# Patient Record
Sex: Male | Born: 1995 | Race: Black or African American | Hispanic: No | Marital: Single
Health system: Midwestern US, Community
[De-identification: ages and names within clinical notes are randomized; demographics above are authoritative.]

## PROBLEM LIST (undated history)

## (undated) DIAGNOSIS — F319 Bipolar disorder, unspecified: Secondary | ICD-10-CM

## (undated) DIAGNOSIS — F209 Schizophrenia, unspecified: Secondary | ICD-10-CM

---

## 2019-07-11 ENCOUNTER — Ambulatory Visit: Payer: Self-pay | Admitting: Psychiatry

## 2020-12-29 ENCOUNTER — Other Ambulatory Visit: Payer: Self-pay

## 2020-12-29 ENCOUNTER — Ambulatory Visit (HOSPITAL_COMMUNITY)
Admission: EM | Admit: 2020-12-29 | Discharge: 2020-12-29 | Disposition: A | Discharge status: Home / Self Care | Payer: PRIVATE HEALTH INSURANCE | Attending: Psychiatry | Admitting: Psychiatry

## 2020-12-29 DIAGNOSIS — F411 Generalized anxiety disorder: Secondary | ICD-10-CM

## 2020-12-29 DIAGNOSIS — F209 Schizophrenia, unspecified: Secondary | ICD-10-CM | POA: Diagnosis not present

## 2020-12-29 LAB — COMPREHENSIVE METABOLIC PANEL
ALT: 24 U/L (ref 0–44)
AST: 32 U/L (ref 15–41)
Albumin: 4.2 g/dL (ref 3.5–5.0)
Alkaline Phosphatase: 61 U/L (ref 38–126)
Anion gap: 7 (ref 5–15)
BUN: <5 mg/dL — ABNORMAL LOW (ref 6–20)
CO2: 28 mmol/L (ref 22–32)
Calcium: 9.5 mg/dL (ref 8.9–10.3)
Chloride: 104 mmol/L (ref 98–111)
Creatinine, Ser: 0.88 mg/dL (ref 0.61–1.24)
GFR, Estimated: >60 mL/min (ref >60)
Glucose, Bld: 96 mg/dL (ref 70–99)
Potassium: 3.8 mmol/L (ref 3.5–5.1)
Sodium: 139 mmol/L (ref 135–145)
Total Bilirubin: 0.6 mg/dL (ref 0.3–1.2)
Total Protein: 6.9 g/dL (ref 6.5–8.1)

## 2020-12-29 LAB — CBC
HCT: 44 % (ref 39.0–52.0)
Hemoglobin: 14 g/dL (ref 13.0–17.0)
MCH: 30.2 pg (ref 26.0–34.0)
MCHC: 31.8 g/dL (ref 30.0–36.0)
MCV: 94.8 fL (ref 80.0–100.0)
Platelets: 177 10*3/uL (ref 150–400)
RBC: 4.64 MIL/uL (ref 4.22–5.81)
RDW: 12.9 % (ref 11.5–15.5)
WBC: 5.6 10*3/uL (ref 4.0–10.5)
nRBC: 0 % (ref 0.0–0.2)

## 2020-12-29 LAB — LIPID PANEL
Cholesterol: 138 mg/dL (ref 0–200)
HDL: 52 mg/dL (ref >40)
LDL Cholesterol: 77 mg/dL (ref 0–99)
Total CHOL/HDL Ratio: 2.7 RATIO
Triglycerides: 47 mg/dL (ref <150)
VLDL: 9 mg/dL (ref 0–40)

## 2020-12-29 LAB — TSH: TSH: 2.574 u[IU]/mL (ref 0.350–4.500)

## 2020-12-29 MED ORDER — ARIPIPRAZOLE 10 MG PO TABS
20.0000 mg | ORAL_TABLET | Freq: Once | ORAL | Status: AC
  Administered 2020-12-29: 20 mg via ORAL
  Filled 2020-12-29: qty 2
  Filled 2020-12-29: qty 14

## 2020-12-29 MED ORDER — ARIPIPRAZOLE 20 MG PO TABS: 20.0000 mg | ORAL_TABLET | Freq: Once | ORAL | 0 refills | Status: DC

## 2020-12-29 NOTE — Discharge Instructions (Signed)
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
## 2020-12-29 NOTE — ED Notes (Signed)
Patient alert and oriented x 4, denies SI, HI and AVH. Patient received after visit summary with follow up instructions to follow up outpatient tomorrow  at the [address] address, on the second floor. Patient discharged home.

## 2020-12-29 NOTE — Progress Notes (Signed)
Patient received Abilify 20 mg PO. Patient able to swallow medication without any problems.
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
## 2020-12-30 ENCOUNTER — Emergency Department (HOSPITAL_COMMUNITY)
Admission: EM | Admit: 2020-12-30 | Discharge: 2021-01-06 | Disposition: A | Discharge status: Home / Self Care | Payer: PRIVATE HEALTH INSURANCE | Attending: Emergency Medicine | Admitting: Emergency Medicine

## 2020-12-30 ENCOUNTER — Encounter (HOSPITAL_COMMUNITY): Payer: Self-pay | Admitting: Psychiatry

## 2020-12-30 ENCOUNTER — Telehealth (HOSPITAL_COMMUNITY): Payer: Self-pay | Admitting: Psychiatry

## 2020-12-30 ENCOUNTER — Other Ambulatory Visit: Payer: Self-pay

## 2020-12-30 ENCOUNTER — Encounter (HOSPITAL_COMMUNITY): Payer: Self-pay

## 2020-12-30 ENCOUNTER — Ambulatory Visit (INDEPENDENT_AMBULATORY_CARE_PROVIDER_SITE_OTHER): Payer: PRIVATE HEALTH INSURANCE | Admitting: Psychiatry

## 2020-12-30 DIAGNOSIS — Z046 Encounter for general psychiatric examination, requested by authority: Secondary | ICD-10-CM | POA: Diagnosis not present

## 2020-12-30 DIAGNOSIS — F1210 Cannabis abuse, uncomplicated: Secondary | ICD-10-CM | POA: Diagnosis not present

## 2020-12-30 DIAGNOSIS — F1994 Other psychoactive substance use, unspecified with psychoactive substance-induced mood disorder: Secondary | ICD-10-CM | POA: Diagnosis present

## 2020-12-30 DIAGNOSIS — F411 Generalized anxiety disorder: Secondary | ICD-10-CM | POA: Insufficient documentation

## 2020-12-30 DIAGNOSIS — Z008 Encounter for other general examination: Secondary | ICD-10-CM | POA: Insufficient documentation

## 2020-12-30 DIAGNOSIS — Z20822 Contact with and (suspected) exposure to covid-19: Secondary | ICD-10-CM | POA: Diagnosis not present

## 2020-12-30 DIAGNOSIS — R456 Violent behavior: Secondary | ICD-10-CM | POA: Diagnosis not present

## 2020-12-30 DIAGNOSIS — F319 Bipolar disorder, unspecified: Secondary | ICD-10-CM | POA: Diagnosis not present

## 2020-12-30 DIAGNOSIS — F3113 Bipolar disorder, current episode manic without psychotic features, severe: Secondary | ICD-10-CM | POA: Diagnosis not present

## 2020-12-30 DIAGNOSIS — F209 Schizophrenia, unspecified: Secondary | ICD-10-CM | POA: Diagnosis present

## 2020-12-30 DIAGNOSIS — F250 Schizoaffective disorder, bipolar type: Secondary | ICD-10-CM | POA: Diagnosis present

## 2020-12-30 HISTORY — DX: Bipolar disorder, unspecified: F31.9

## 2020-12-30 HISTORY — DX: Schizophrenia, unspecified: F20.9

## 2020-12-30 LAB — COMPREHENSIVE METABOLIC PANEL
ALT: 25 U/L (ref 0–44)
AST: 40 U/L (ref 15–41)
Albumin: 4.5 g/dL (ref 3.5–5.0)
Alkaline Phosphatase: 67 U/L (ref 38–126)
Anion gap: 11 (ref 5–15)
BUN: 9 mg/dL (ref 6–20)
CO2: 24 mmol/L (ref 22–32)
Calcium: 10.1 mg/dL (ref 8.9–10.3)
Chloride: 102 mmol/L (ref 98–111)
Creatinine, Ser: 0.9 mg/dL (ref 0.61–1.24)
GFR, Estimated: >60 mL/min (ref >60)
Glucose, Bld: 124 mg/dL — ABNORMAL HIGH (ref 70–99)
Potassium: 4.6 mmol/L (ref 3.5–5.1)
Sodium: 137 mmol/L (ref 135–145)
Total Bilirubin: 0.3 mg/dL (ref 0.3–1.2)
Total Protein: 7.9 g/dL (ref 6.5–8.1)

## 2020-12-30 LAB — HEMOGLOBIN A1C
Hgb A1c MFr Bld: 5.3 % (ref 4.8–5.6)
Mean Plasma Glucose: 105 mg/dL

## 2020-12-30 LAB — CBC
HCT: 43.8 % (ref 39.0–52.0)
Hemoglobin: 14.4 g/dL (ref 13.0–17.0)
MCH: 30.6 pg (ref 26.0–34.0)
MCHC: 32.9 g/dL (ref 30.0–36.0)
MCV: 93 fL (ref 80.0–100.0)
Platelets: 199 10*3/uL (ref 150–400)
RBC: 4.71 MIL/uL (ref 4.22–5.81)
RDW: 13.2 % (ref 11.5–15.5)
WBC: 10.5 10*3/uL (ref 4.0–10.5)
nRBC: 0 % (ref 0.0–0.2)

## 2020-12-30 LAB — RESP PANEL BY RT-PCR (FLU A&B, COVID) ARPGX2
Influenza A by PCR: NEGATIVE
Influenza B by PCR: NEGATIVE
SARS Coronavirus 2 by RT PCR: NEGATIVE

## 2020-12-30 LAB — ETHANOL: Alcohol, Ethyl (B): <10 mg/dL (ref <10)

## 2020-12-30 LAB — SALICYLATE LEVEL: Salicylate Lvl: <7 mg/dL — ABNORMAL LOW (ref 7.0–30.0)

## 2020-12-30 LAB — ACETAMINOPHEN LEVEL: Acetaminophen (Tylenol), Serum: <10 ug/mL — ABNORMAL LOW (ref 10–30)

## 2020-12-30 MED ORDER — ARIPIPRAZOLE ER 400 MG IM PRSY: 400.0000 mg | PREFILLED_SYRINGE | Freq: Once | INTRAMUSCULAR | Status: DC

## 2020-12-30 MED ORDER — HYDROXYZINE HCL 10 MG PO TABS: 10.0000 mg | ORAL_TABLET | Freq: Three times a day (TID) | ORAL | 2 refills | Status: DC | PRN

## 2020-12-30 MED ORDER — MIRTAZAPINE 15 MG PO TABS: 15.0000 mg | ORAL_TABLET | Freq: Every day | ORAL | 2 refills | Status: DC

## 2020-12-30 NOTE — ED Notes (Signed)
Pt unable to sign MSE. In police custody under emergency IVC
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
## 2020-12-30 NOTE — Telephone Encounter (Signed)
Pt called requesting send today's presctiptions to [facility] on [address]

## 2020-12-30 NOTE — ED Notes (Signed)
Pt changed into purple scrubs, wanded by security, belongings placed in labeled bag and removed from room. Belongings consisted of a shirt, shoes, and a pair of pants.
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
## 2020-12-30 NOTE — ED Provider Notes (Signed)
[facility]Y DEPT [facility] Emergency Department Provider Note MRN:  [id number]  Arrival date & time: 12/31/20     Chief Complaint   Psychiatric Evaluation (Emergency IVC)   History of Present Illness   Andres Mccall is a 25 y.o. year-old male with a history of bipolar disorder presenting to the ED with chief complaint of psychiatric evaluation.  Patient was reportedly trying to and to his girlfriend's house.  Was banging on the door, wielding a gun.  And seemed to be talking to himself or hallucinating or responding to external stimuli.  Here under IVC.  Denies hallucinations, denies SI or HI.  Was intermittently agitated with police.  Not taking his medications.  Review of Systems  A complete 10 system review of systems was obtained and all systems are negative except as noted in the HPI and PMH.   Patient's Health History    Past Medical History:  Diagnosis Date  . Bipolar affective (HCC)   . Schizophrenia (HCC)     No past surgical history on file.  No family history on file.  Social History   Socioeconomic History  . Marital status: Single    Spouse name: Not on file  . Number of children: Not on file  . Years of education: Not on file  . Highest education level: Not on file  Occupational History  . Not on file  Tobacco Use  . Smoking status: Not on file  . Smokeless tobacco: Not on file  Substance and Sexual Activity  . Alcohol use: Not on file  . Drug use: Not on file  . Sexual activity: Not on file  Other Topics Concern  . Not on file  Social History Narrative  . Not on file   Social Determinants of Health   Financial Resource Strain: Not on file  Food Insecurity: Not on file  Transportation Needs: Not on file  Physical Activity: Not on file  Stress: Not on file  Social Connections: Not on file  Intimate Partner Violence: Not on file     Physical Exam   Vitals:   12/30/20 2234 12/30/20 2340  BP: (!) 175/109 (!) 143/100  Pulse: (!) 133  (!) 110  Resp: 20 (!) 25  Temp: 100.1 F (37.8 C)   SpO2: 99% 97%    CONSTITUTIONAL: Well-appearing, NAD NEURO:  Alert and oriented x 3, no focal deficits EYES:  eyes equal and reactive ENT/NECK:  no LAD, no JVD CARDIO: Tachycardic rate, well-perfused, normal S1 and S2 PULM:  CTAB no wheezing or rhonchi GI/GU:  normal bowel sounds, non-distended, non-tender MSK/SPINE:  No gross deformities, no edema SKIN:  no rash, atraumatic PSYCH: Withdrawn speech and behavior  *Additional and/or pertinent findings included in MDM below  Diagnostic and Interventional Summary    EKG Interpretation  Date/Time:    Ventricular Rate:    PR Interval:    QRS Duration:   QT Interval:    QTC Calculation:   R Axis:     Text Interpretation:        Labs Reviewed  COMPREHENSIVE METABOLIC PANEL - Abnormal; Notable for the following components:      Result Value   Glucose, Bld 124 (*)    All other components within normal limits  SALICYLATE LEVEL - Abnormal; Notable for the following components:   Salicylate Lvl <7.0 (*)    All other components within normal limits  ACETAMINOPHEN LEVEL - Abnormal; Notable for the following components:   Acetaminophen (Tylenol), Serum <10 (*)  All other components within normal limits  RAPID URINE DRUG SCREEN, HOSP PERFORMED - Abnormal; Notable for the following components:   Tetrahydrocannabinol POSITIVE (*)    All other components within normal limits  RESP PANEL BY RT-PCR (FLU A&B, COVID) ARPGX2  ETHANOL  CBC    No orders to display    Medications - No data to display   Procedures  /  Critical Care Procedures  ED Course and Medical Decision Making  I have reviewed the triage vital signs, the nursing notes, and pertinent available records from the EMR.  Listed above are laboratory and imaging tests that I personally ordered, reviewed, and interpreted and then considered in my medical decision making (see below for details).  IVC, unstable  psychiatric condition, noncompliant with medications, currently is calm will initiate labs for psych clearance and will need TTS evaluation.     Medically cleared, awaiting TTS recommendations, signed out to default provider.  Elmer Sow. Pilar Plate, MD [facility] Emergency Medicine [facility] [email or web address]  Final Clinical Impressions(s) / ED Diagnoses     ICD-10-CM   1. Encounter for psychological evaluation  Z00.8   2. Violent behavior  R45.6     ED Discharge Orders    None       Discharge Instructions Discussed with and Provided to Patient:   Discharge Instructions   None       Sabas Sous, MD 12/31/20 [phone]

## 2020-12-31 LAB — RAPID URINE DRUG SCREEN, HOSP PERFORMED
Amphetamines: NOT DETECTED
Barbiturates: NOT DETECTED
Benzodiazepines: NOT DETECTED
Cocaine: NOT DETECTED
Opiates: NOT DETECTED
Tetrahydrocannabinol: POSITIVE — AB

## 2020-12-31 MED ORDER — ARIPIPRAZOLE 10 MG PO TABS
20.0000 mg | ORAL_TABLET | Freq: Every day | ORAL | Status: DC
  Administered 2020-12-31 – 2021-01-06 (×7): 20 mg via ORAL
  Filled 2020-12-31 (×7): qty 2

## 2020-12-31 MED ORDER — NICOTINE 21 MG/24HR TD PT24
21.0000 mg | MEDICATED_PATCH | Freq: Every day | TRANSDERMAL | Status: DC
  Administered 2020-12-31 – 2021-01-06 (×7): 21 mg via TRANSDERMAL
  Filled 2020-12-31 (×7): qty 1

## 2020-12-31 MED ORDER — MIRTAZAPINE 7.5 MG PO TABS
15.0000 mg | ORAL_TABLET | Freq: Every day | ORAL | Status: DC
  Administered 2020-12-31 – 2021-01-05 (×6): 15 mg via ORAL
  Filled 2020-12-31 (×6): qty 2

## 2020-12-31 MED ORDER — HYDROXYZINE HCL 25 MG PO TABS
25.0000 mg | ORAL_TABLET | Freq: Three times a day (TID) | ORAL | Status: DC | PRN
  Administered 2020-12-31 – 2021-01-06 (×13): 25 mg via ORAL
  Filled 2020-12-31 (×13): qty 1

## 2020-12-31 MED ORDER — ZIPRASIDONE MESYLATE 20 MG IM SOLR
20.0000 mg | Freq: Once | INTRAMUSCULAR | Status: AC | PRN
  Administered 2021-01-01: 20 mg via INTRAMUSCULAR
  Filled 2020-12-31: qty 20

## 2020-12-31 MED ORDER — OLANZAPINE 5 MG PO TBDP
2.5000 mg | ORAL_TABLET | Freq: Two times a day (BID) | ORAL | Status: DC
  Administered 2020-12-31 – 2021-01-05 (×11): 2.5 mg via ORAL
  Filled 2020-12-31 (×11): qty 1

## 2020-12-31 NOTE — ED Notes (Signed)
Spoke with pt's mother on the phone, she would like to be called prior to patient discharge because she is his mode of safe transportation. She would also like to be updated on plan of care.

## 2020-12-31 NOTE — ED Notes (Signed)
Patient told NT that he was going to "bust" out of here at 10 pm.

## 2020-12-31 NOTE — ED Notes (Signed)
Pt grandmother, Rose Phi, called. Transferred call to pt room.
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
## 2020-12-31 NOTE — ED Notes (Signed)
Pt has been calm and cooperative all day. Prior to 13:45, he reported anxiety and frequently paced around his room. After receiving medication, he has not reported anxiety, and has been watching TV or sleeping in his bed.

## 2020-12-31 NOTE — ED Notes (Signed)
Pt is calm and cooperative. Quietly watching TV in bed.

## 2020-12-31 NOTE — ED Notes (Signed)
Pt's grandmother called to speak with pt, but he was asleep. Requested he call back.

## 2020-12-31 NOTE — ED Notes (Signed)
Called pt mother and transferred the call to a phone I took to his room
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
## 2020-12-31 NOTE — BH Assessment (Addendum)
Per Liborio Nixon, NP, this involuntary pt requires psychiatric hospitalization at this time.  At the direction of Nelly Rout, MD, this writer has begun searching for placement outside of the [facility] system.  The following facilities have been contacted to seek placement for this pt, with results as noted:  Beds available, information sent, decision pending: [facility]  Unable to reach: Earlene Plater (left message at 13:09)  At capacity: [facility]  [place] [facility] [place], [place] [id number] Health Coordinator [phone]

## 2020-12-31 NOTE — ED Notes (Addendum)
Pt one belonging bag placed in locker #[id number]

## 2020-12-31 NOTE — ED Notes (Signed)
Pt refused vitals 

## 2020-12-31 NOTE — Consult Note (Signed)
Psych consult received for medication mange ment. Patient currently being treated for substance induced mood disorder, that now appears to be psychosis. Will resume home medications and initiate olanzapine in hopes of early stabilization of acute psychiatric symptoms. Patient remains under the care of TTS at this time.   -Will order EKG as patient is receiving 2 antipsychotics.  -Continue working closely with [facility] for inpatient referrals. If necessary refer out of system if no beds at [facility].  -Labs reviewed appear to be normal except UDS positive for THC.  -Will add olanzapine 2.5mg  po BID. Orders placed. Above information was communicated with Dr. Effie Shy.  -Psychiatry to sign off at this time.

## 2020-12-31 NOTE — ED Provider Notes (Signed)
Emergency Medicine Observation Re-evaluation Note  Ryzen Deady is a 25 y.o. male, seen on rounds today.  Pt initially presented to the ED for complaints of Psychiatric Evaluation (Emergency IVC) Currently, the patient is sitting on his bed and appears fairly calm.  Physical Exam  BP (!) 144/97 (BP Location: Right Arm)   Pulse 79   Temp 98.4 F (36.9 C) (Oral)   Resp 20   Ht 6\' 2"  (1.88 m)   Wt 70.3 kg   SpO2 97%   BMI 19.90 kg/m  Physical Exam General: Well developed. Cardiac: Normal heart rate Lungs: Normal respiratory Psych: Not currently responding to internal stimuli  ED Course / MDM  EKG:   I have reviewed the labs performed to date as well as medications administered while in observation.  Recent changes in the last 24 hours include he is repeatedly asking for medications for anxiety.  His usual medicines of Atarax and Abilify have not yet been started.  I will order these.  I will also have psychiatry for additional recommendations for medications.  Plan  Current plan is for psychiatric admission. Patient is under full IVC at this time.   , MD 12/31/20 [phone]

## 2020-12-31 NOTE — ED Notes (Addendum)
Andres Mccall, pt mother, can be reached at [phone] with update on pt's status with getting transferred or being discharged.

## 2021-01-01 DIAGNOSIS — F209 Schizophrenia, unspecified: Secondary | ICD-10-CM | POA: Diagnosis present

## 2021-01-01 DIAGNOSIS — F250 Schizoaffective disorder, bipolar type: Secondary | ICD-10-CM | POA: Diagnosis present

## 2021-01-01 DIAGNOSIS — F319 Bipolar disorder, unspecified: Secondary | ICD-10-CM | POA: Diagnosis present

## 2021-01-01 MED ORDER — ZIPRASIDONE MESYLATE 20 MG IM SOLR
20.0000 mg | INTRAMUSCULAR | Status: AC | PRN
  Administered 2021-01-01: 20 mg via INTRAMUSCULAR
  Filled 2021-01-01: qty 20

## 2021-01-01 MED ORDER — STERILE WATER FOR INJECTION IJ SOLN
INTRAMUSCULAR | Status: AC
  Administered 2021-01-01: 1.2 mL
  Filled 2021-01-01: qty 10

## 2021-01-01 MED ORDER — ACETAMINOPHEN 325 MG PO TABS
650.0000 mg | ORAL_TABLET | Freq: Once | ORAL | Status: AC
  Administered 2021-01-01: 650 mg via ORAL
  Filled 2021-01-01: qty 2

## 2021-01-01 MED ORDER — ACETAMINOPHEN 500 MG PO TABS
1000.0000 mg | ORAL_TABLET | Freq: Once | ORAL | Status: AC
  Administered 2021-01-01: 1000 mg via ORAL
  Filled 2021-01-01: qty 2

## 2021-01-01 MED ORDER — LORAZEPAM 1 MG PO TABS
1.0000 mg | ORAL_TABLET | ORAL | Status: AC | PRN
  Administered 2021-01-01: 1 mg via ORAL
  Filled 2021-01-01: qty 1

## 2021-01-01 MED ORDER — LIP MEDEX EX OINT
TOPICAL_OINTMENT | Freq: Once | CUTANEOUS | Status: AC
  Administered 2021-01-01: 1 via TOPICAL
  Filled 2021-01-01: qty 7

## 2021-01-01 MED ORDER — ZIPRASIDONE MESYLATE 20 MG IM SOLR
20.0000 mg | Freq: Once | INTRAMUSCULAR | Status: AC | PRN
  Administered 2021-01-01: 20 mg via INTRAMUSCULAR
  Filled 2021-01-01: qty 20

## 2021-01-01 MED ORDER — OLANZAPINE 5 MG PO TBDP
5.0000 mg | ORAL_TABLET | Freq: Three times a day (TID) | ORAL | Status: DC | PRN
  Administered 2021-01-02 (×2): 5 mg via ORAL
  Filled 2021-01-01 (×2): qty 1

## 2021-01-01 MED ORDER — STERILE WATER FOR INJECTION IJ SOLN
INTRAMUSCULAR | Status: AC
  Administered 2021-01-01: 10 mL
  Filled 2021-01-01: qty 10

## 2021-01-01 NOTE — ED Notes (Signed)
Pt eat a sandwich, some crackers and juice

## 2021-01-01 NOTE — ED Notes (Signed)
Patient is walking out of room, asking to see his admission papers. RN explained that patient was IVC ed and there were no papers to show him. Patient will be here until behavioral health inpatient bed is found for him. Patient is walking out of room to speak with [employer] who are sitting with another patient. Patient walking out of room, cursing and getting agitated.

## 2021-01-01 NOTE — ED Notes (Signed)
Patient lying on floor with pillow and blanket. Staff requested that he return to his bed
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
## 2021-01-01 NOTE — ED Notes (Signed)
Received call from patient's mother.  Informed her of patient's restraint episode.  Mother states understanding.

## 2021-01-01 NOTE — BH Assessment (Signed)
[facility] Assessment Progress Note  Per Maxie Barb, NP, this involuntary pt continues to require psychiatric hospitalization at this time.  The following facilities have been contacted to seek placement for this pt, with results as noted:  Beds available, information sent, decision pending: [facility]  Unable to reach: [facility] (left message at 15:41) [facility] (left message at 15:42)  At capacity: [facility]   Doylene Canning, MA Behavioral Health Coordinator [phone]

## 2021-01-02 MED ORDER — ACETAMINOPHEN 500 MG PO TABS
1000.0000 mg | ORAL_TABLET | Freq: Four times a day (QID) | ORAL | Status: DC | PRN
  Administered 2021-01-02 – 2021-01-06 (×7): 1000 mg via ORAL
  Filled 2021-01-02 (×9): qty 2

## 2021-01-02 MED ORDER — LORAZEPAM 1 MG PO TABS
1.0000 mg | ORAL_TABLET | ORAL | Status: AC | PRN
  Administered 2021-01-02: 1 mg via ORAL
  Filled 2021-01-02: qty 1

## 2021-01-02 MED ORDER — OLANZAPINE 5 MG PO TBDP
5.0000 mg | ORAL_TABLET | Freq: Three times a day (TID) | ORAL | Status: DC | PRN
  Administered 2021-01-03: 5 mg via ORAL
  Filled 2021-01-02: qty 1

## 2021-01-02 MED ORDER — STERILE WATER FOR INJECTION IJ SOLN
INTRAMUSCULAR | Status: AC
  Administered 2021-01-02: 1.2 mL
  Filled 2021-01-02: qty 10

## 2021-01-02 MED ORDER — ZIPRASIDONE MESYLATE 20 MG IM SOLR
20.0000 mg | INTRAMUSCULAR | Status: AC | PRN
  Administered 2021-01-02: 20 mg via INTRAMUSCULAR
  Filled 2021-01-02: qty 20

## 2021-01-02 MED ORDER — DIPHENHYDRAMINE HCL 50 MG/ML IJ SOLN
25.0000 mg | Freq: Once | INTRAMUSCULAR | Status: AC
  Administered 2021-01-04: 25 mg via INTRAMUSCULAR
  Filled 2021-01-02 (×2): qty 1

## 2021-01-02 MED ORDER — ACETAMINOPHEN 500 MG PO TABS
1000.0000 mg | ORAL_TABLET | Freq: Once | ORAL | Status: AC
  Administered 2021-01-02: 1000 mg via ORAL
  Filled 2021-01-02: qty 2

## 2021-01-02 NOTE — ED Provider Notes (Signed)
Emergency Medicine Observation Re-evaluation Note  Andres Mccall is a 25 y.o. male, seen on rounds today.  Pt initially presented to the ED for complaints of Psychiatric Evaluation (Emergency IVC) Currently, the patient is awaiting placement.  Physical Exam  BP (!) 119/92 (BP Location: Left Arm)   Pulse 94   Temp 98.3 F (36.8 C) (Oral)   Resp 16   Ht 6\' 2"  (1.88 m)   Wt 70.3 kg   SpO2 100%   BMI 19.90 kg/m  Physical Exam General: awake, alert, no distress Cardiac: normal HR Lungs: normal effort Psych: psychotic, though not agitated  ED Course / MDM  EKG:EKG Interpretation  Date/Time:  Thursday Dec 31 2020 14:19:56 EDT Ventricular Rate:  75 PR Interval:  118 QRS Duration: 84 QT Interval:  356 QTC Calculation: 397 R Axis:   95 Text Interpretation: Normal sinus rhythm Rightward axis Borderline ECG No old tracing to compare Confirmed by 01-31-1983 [phone]) on 12/31/2020 3:45:43 PM   I have reviewed the labs performed to date as well as medications administered while in observation.  Recent changes in the last 24 hours include prn medications added.  Plan  Current plan is for inpatient placement. Patient is under full IVC at this time.   01/02/2021, MD 01/02/21 [phone]

## 2021-01-02 NOTE — ED Notes (Signed)
Breakfast tray provided. 

## 2021-01-02 NOTE — ED Notes (Signed)
Provided pt with sandwich and soda 

## 2021-01-02 NOTE — BH Assessment (Signed)
This Probation officer met with patient this date to evaluate current mental health status. Patient denies any S/I, H/I or VH although reports ongoing VH but is vague in reference to content. Patient is observed to be somewhat distracted as this Probation officer interacts. Patient is circumstantial although can be redirected. Patient asks somewhat bizarre questions unrelated to assessment. Patient appears to be making progress although Leevy-Johnson continues to recommended a inpatient admission.

## 2021-01-02 NOTE — Progress Notes (Signed)
Per Brooke Leevy-Johnson,NP, patient meets criteria for inpatient treatment. There are no available or appropriate beds at [facility] today. CSW faxed referrals to the following facilities for review:  [facility]  TTS will continue to seek bed placement.  Cindel Daugherty, MSW, LCSW-A, LCAS-A Phone: [phone] Disposition/TOC  

## 2021-01-02 NOTE — ED Notes (Addendum)
[facility] in [place] provided pt last Abilify injection on 08/12/2020. Maurine Minister is listed as a contact for confirmation [phone] or [phone].

## 2021-01-02 NOTE — ED Notes (Signed)
Pt repeatedly approaching nurse station asking for food and drink. Provided one soda and declined other requests, including to use the phone. Pt requested something for pain, informed provider and received tylenol order. Pt refused benadryl shot stating "I've already had 3 shots since I've been here." Pt up walking around room but calm.

## 2021-01-03 MED ORDER — ZIPRASIDONE MESYLATE 20 MG IM SOLR
20.0000 mg | INTRAMUSCULAR | Status: AC | PRN
  Administered 2021-01-03: 20 mg via INTRAMUSCULAR
  Filled 2021-01-03: qty 20

## 2021-01-03 MED ORDER — LORAZEPAM 2 MG/ML IJ SOLN
2.0000 mg | Freq: Once | INTRAMUSCULAR | Status: AC
  Administered 2021-01-03: 2 mg via INTRAMUSCULAR
  Filled 2021-01-03: qty 1

## 2021-01-03 MED ORDER — OLANZAPINE 5 MG PO TBDP
5.0000 mg | ORAL_TABLET | Freq: Three times a day (TID) | ORAL | Status: DC | PRN
  Administered 2021-01-04 – 2021-01-05 (×2): 5 mg via ORAL
  Filled 2021-01-03 (×3): qty 1

## 2021-01-03 MED ORDER — LORAZEPAM 1 MG PO TABS
1.0000 mg | ORAL_TABLET | ORAL | Status: AC | PRN
  Administered 2021-01-05: 1 mg via ORAL
  Filled 2021-01-03 (×2): qty 1

## 2021-01-03 MED ORDER — STERILE WATER FOR INJECTION IJ SOLN
INTRAMUSCULAR | Status: AC
  Administered 2021-01-03: 1 mL
  Filled 2021-01-03: qty 10

## 2021-01-03 MED ORDER — POLYVINYL ALCOHOL 1.4 % OP SOLN
1.0000 [drp] | OPHTHALMIC | Status: DC | PRN
  Administered 2021-01-04: 1 [drp] via OPHTHALMIC
  Filled 2021-01-03: qty 15

## 2021-01-03 MED ORDER — MENTHOL 3 MG MT LOZG
1.0000 | LOZENGE | OROMUCOSAL | Status: DC | PRN
  Administered 2021-01-03: 3 mg via ORAL
  Filled 2021-01-03 (×2): qty 9

## 2021-01-03 NOTE — ED Notes (Signed)
Patient became very upset when told that he couldn't leave the hospital. He is pulling the code blue alarm and demanding that the doctor comes in and speaks to him. Will continue to monitor

## 2021-01-03 NOTE — ED Provider Notes (Addendum)
Andres Mccall has again continued to repetitively activate CODE BLUE alarm from his room.  He is requesting someone to speak with him about his medication.  I have requested another psychiatry consult for med management.   Koleen Distance, MD 01/03/21 1753   Patient already received IM Geodon at 5 PM, and he is currently still quite agitated.  I was asked for further medication, and he will receive benzodiazepine.  Med adjustment might be necessary per psychiatry as requested above.   Koleen Distance, MD 01/03/21 2101
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
## 2021-01-03 NOTE — ED Notes (Signed)
Throughout the night the patient has had several mood swings and became angry and started throwing stuff around at one point. Patient calmed himself down and asked for some medicine. People went to sleep and woke up demanding things. Had to be redirected several times. Patient is currently resting in his room.

## 2021-01-03 NOTE — ED Provider Notes (Signed)
Emergency Medicine Observation Re-evaluation Note  Andres Mccall is a 25 y.o. male, seen on rounds today.  Pt initially presented to the ED for complaints of Psychiatric Evaluation (Emergency IVC) .  Physical Exam  BP 120/81 (BP Location: Left Arm)   Pulse 65   Temp 97.9 F (36.6 C) (Oral)   Resp 19   Ht 6\' 2"  (1.88 m)   Wt 70.3 kg   SpO2 100%   BMI 19.90 kg/m  Physical Exam General: calm, cooperative Cardiac: normal rate Lungs: no increased WOB Psych: calm, cooperative  ED Course / MDM  EKG:EKG Interpretation  Date/Time:  Thursday Dec 31 2020 14:19:56 EDT Ventricular Rate:  75 PR Interval:  118 QRS Duration: 84 QT Interval:  356 QTC Calculation: 397 R Axis:   95 Text Interpretation: Normal sinus rhythm Rightward axis Borderline ECG No old tracing to compare Confirmed by 01-31-1983 [phone]) on 12/31/2020 3:45:43 PM   I have reviewed the labs performed to date as well as medications administered while in observation.  Recent changes in the last 24 hours include none.  Plan  Current plan is for awaiting placement to psych facility. Patient is under full IVC at this time.   01/02/2021, MD 01/03/21 [phone]

## 2021-01-04 MED ORDER — LORAZEPAM 2 MG/ML IJ SOLN
2.0000 mg | Freq: Once | INTRAMUSCULAR | Status: AC
  Administered 2021-01-04: 2 mg via INTRAMUSCULAR
  Filled 2021-01-04: qty 1

## 2021-01-04 MED ORDER — STERILE WATER FOR INJECTION IJ SOLN
INTRAMUSCULAR | Status: AC
  Administered 2021-01-04: 10 mL
  Filled 2021-01-04: qty 10

## 2021-01-04 MED ORDER — ACETAMINOPHEN 325 MG PO TABS: 650.0000 mg | ORAL_TABLET | Freq: Once | ORAL | Status: DC

## 2021-01-04 MED ORDER — LIP MEDEX EX OINT
TOPICAL_OINTMENT | Freq: Once | CUTANEOUS | Status: AC
  Filled 2021-01-04: qty 7

## 2021-01-04 MED ORDER — ZIPRASIDONE MESYLATE 20 MG IM SOLR
20.0000 mg | Freq: Once | INTRAMUSCULAR | Status: AC
  Administered 2021-01-04: 20 mg via INTRAMUSCULAR
  Filled 2021-01-04: qty 20

## 2021-01-04 MED ORDER — LORAZEPAM 1 MG PO TABS
1.0000 mg | ORAL_TABLET | Freq: Once | ORAL | Status: AC
  Administered 2021-01-04: 1 mg via ORAL
  Filled 2021-01-04: qty 1

## 2021-01-04 NOTE — ED Provider Notes (Signed)
Emergency Medicine Observation Re-evaluation Note  Andres Mccall is a 25 y.o. male, seen on rounds today.  Pt initially presented to the ED for complaints of Psychiatric Evaluation (Emergency IVC) Currently, the patient is more active and has required medications at time  Physical Exam  BP 132/84 (BP Location: Right Arm)   Pulse 100   Temp 98 F (36.7 C) (Oral)   Resp 18   Ht 1.88 m (6\' 2" )   Wt 70.3 kg   SpO2 100%   BMI 19.90 kg/m  Physical Exam  ED Course / MDM  EKG:EKG Interpretation  Date/Time:  Thursday Dec 31 2020 14:19:56 EDT Ventricular Rate:  75 PR Interval:  118 QRS Duration: 84 QT Interval:  356 QTC Calculation: 397 R Axis:   95 Text Interpretation: Normal sinus rhythm Rightward axis Borderline ECG No old tracing to compare Confirmed by [birth year]1982 [phone]) on 12/31/2020 3:45:43 PM   I have reviewed the labs performed to date as well as medications administered while in observation.  Recent changes in the last 24 hours include has required additional medication  Plan  Current plan is for placement. Patient is under full IVC at this time.   01/02/2021, MD 01/04/21 [phone]

## 2021-01-04 NOTE — ED Notes (Signed)
Pt initially requested eye drops and cough drop but refused once staff acquired and offered medication to him.

## 2021-01-04 NOTE — BH Assessment (Addendum)
Disposition:  Patient meets inpatient criteria per Doran Heater, NP . Per chart review, "Patient information has been sent to [facility] via secure chat to review for potential admission. Situation ongoing, CSW will continue to monitor progress".  Evening Disposition Counselor followed up with [facility] (Danika, RN) @[phone] , regarding patient's potential acceptance to [facility]. Per [facility], RN, no beds at [facility]. Patient re-faxed to the following facilities for consideration of bed placement.     [facility]  [facility]  [facility]  [facility]  [facility]  [facility] -@[phone]  [facility] called and stated that they are reviewing patient for admission. Requested UDS/BAL and those items were faxed at [phone]. [facility]  [facility]  [facility]  [facility]  [facility]  [facility]  [facility]  [facility]  [facility]  [facility]  [facility]  [facility]  [facility]  [facility]  [facility]  [facility]  [facility]  [facility]

## 2021-01-04 NOTE — ED Notes (Signed)
Called dietary and requested chicken fingers and fries which is appropriate for finger foods.

## 2021-01-04 NOTE — ED Notes (Signed)
Pt does not appear to be responding to internal stimuli. He has taken all medication offered and has even asked for some PRN this morning. Since taking medication he is resting more comfortably with fewer trips to the door.

## 2021-01-04 NOTE — ED Notes (Signed)
Pt's grandmother called and insisted that the NP call her because "He is not getting better."  This Clinical research associate explained that the NP is not available to call family at this time.  This Clinical research associate did not give out any HIPPA-related information about pt.  She wanted to know the administrators name.
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
## 2021-01-04 NOTE — ED Notes (Signed)
Patient has been an issue since he has been back here in TCU. His is getting ramped up again. Trying to walk out of room and cursing staff. Patient is threatening to leave however he is has IVC in place. All of security is back here trying to not to let him leave area.  Somehow patient has gotten a copy of his IVC and thinks he can leave.

## 2021-01-04 NOTE — ED Notes (Signed)
Patient was becoming agitated and pacing again. He walked out to nurses station with no shirt on. Patient is asking for his medications to be increased. Patient does not comprehend that he is IVC and can not leave. RN requested Dr.Curatola ED, to come back and talk to patient which he has done

## 2021-01-04 NOTE — Progress Notes (Signed)
Patient information has been sent to [facility] via secure chat to review for potential admission. Patient meets inpatient criteria per Maxie Barb, NP.   Situation ongoing, CSW will continue to monitor progress.    Signed:  Damita Dunnings, MSW, LCSW-A  01/04/2021 10:17 AM

## 2021-01-04 NOTE — ED Notes (Signed)
Finger foods ordered for pt with staff aware to not give any eating utensils r/t pt having a shank made out of a hospital toothbrush in his sock yesterday.  Pt is attention seeking, anxious and agitated, but not harsh with staff and is redirectable today. He was pleasant with this Clinical research associate and took offered medications appropriately.
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
## 2021-01-05 ENCOUNTER — Encounter (HOSPITAL_COMMUNITY): Payer: PRIVATE HEALTH INSURANCE | Admitting: Psychiatry

## 2021-01-05 ENCOUNTER — Encounter (HOSPITAL_COMMUNITY): Payer: Self-pay

## 2021-01-05 ENCOUNTER — Ambulatory Visit (HOSPITAL_COMMUNITY): Payer: PRIVATE HEALTH INSURANCE

## 2021-01-05 DIAGNOSIS — F3113 Bipolar disorder, current episode manic without psychotic features, severe: Secondary | ICD-10-CM | POA: Diagnosis not present

## 2021-01-05 MED ORDER — LORAZEPAM 1 MG PO TABS
1.0000 mg | ORAL_TABLET | Freq: Three times a day (TID) | ORAL | Status: AC | PRN
Start: 2021-01-05 — End: 2021-01-06
  Administered 2021-01-06: 1 mg via ORAL
  Filled 2021-01-05: qty 1

## 2021-01-05 MED ORDER — OLANZAPINE 5 MG PO TBDP
5.0000 mg | ORAL_TABLET | Freq: Three times a day (TID) | ORAL | Status: DC | PRN
  Administered 2021-01-05: 5 mg via ORAL
  Filled 2021-01-05: qty 1

## 2021-01-05 MED ORDER — ZIPRASIDONE MESYLATE 20 MG IM SOLR: 20.0000 mg | INTRAMUSCULAR | Status: DC | PRN

## 2021-01-05 MED ORDER — OLANZAPINE 10 MG PO TBDP
10.0000 mg | ORAL_TABLET | Freq: Two times a day (BID) | ORAL | Status: DC
  Administered 2021-01-05 – 2021-01-06 (×2): 10 mg via ORAL
  Filled 2021-01-05 (×2): qty 1

## 2021-01-05 MED ORDER — LIP MEDEX EX OINT
TOPICAL_OINTMENT | CUTANEOUS | Status: DC | PRN
  Filled 2021-01-05: qty 7

## 2021-01-05 MED ORDER — OLANZAPINE 5 MG PO TBDP
7.5000 mg | ORAL_TABLET | Freq: Once | ORAL | Status: AC
  Administered 2021-01-05: 7.5 mg via ORAL
  Filled 2021-01-05: qty 2

## 2021-01-05 NOTE — ED Notes (Addendum)
Patient given Olanzapine and Hydroxyzine per his request 30 minutes prior. Patient continues to exit room asking questions and requesting staff provide him with a toothbrush. Request denied due to report that patient used his toothbrush the day prior to construct a weapon. Additionally patient still requesting medication for his anxiety. Agitation continues.

## 2021-01-05 NOTE — ED Notes (Addendum)
Patient continuously exiting room, disturbing staff and other patients. Patient instructed to return to his room without success. Patient becoming increasingly agitated and requesting medication for agitation and anxiety. Informed by this nurse that medication will be provided shortly.

## 2021-01-05 NOTE — BH Assessment (Signed)
[facility] Assessment Progress Note  Per Maxie Barb, NP, this involuntary pt continues to require psychiatric hospitalization.  The following facilities have been contacted to seek placement for this pt, with results as noted:  Beds available, information sent, decision pending: [facility]  Unable to reach: [facility] (left message at 13:19)  At capacity: [facility]   Doylene Canning, [place] [facility] Coordinator [phone]

## 2021-01-05 NOTE — ED Provider Notes (Signed)
Emergency Medicine Observation Re-evaluation Note  Andres Mccall is a 25 y.o. male, seen on rounds today.  Pt initially presented to the ED for complaints of Psychiatric Evaluation (Emergency IVC) Currently, the patient is resting in bed  Physical Exam  BP 126/80 (BP Location: Right Arm)   Pulse (!) 103   Temp 98.1 F (36.7 C) (Oral)   Resp 20   Ht 1.88 m (6\' 2" )   Wt 70.3 kg   SpO2 99%   BMI 19.90 kg/m  Physical Exam General: Sleeping   ED Course / MDM  EKG:EKG Interpretation  Date/Time:  Thursday Dec 31 2020 14:19:56 EDT Ventricular Rate:  75 PR Interval:  118 QRS Duration: 84 QT Interval:  356 QTC Calculation: 397 R Axis:   95 Text Interpretation: Normal sinus rhythm Rightward axis Borderline ECG No old tracing to compare Confirmed by 01-31-1983 [phone]) on 12/31/2020 3:45:43 PM   I have reviewed the labs performed to date as well as medications administered while in observation.  Recent changes in the last 24 hours include patient has been more agitated overnight.  Has had increasing anxiety..  Plan  Current plan is for psychiatry to adjust patient's medications Patient is under full IVC at this time.   01/02/2021, MD 01/05/21 [phone]

## 2021-01-05 NOTE — Consult Note (Signed)
[facility] Face-to-Face Psychiatry Consult   Reason for Consult:  Psychiatric evaluation  Referring Physician:  Dr. Freida Busman  Patient Identification: Andres Mccall MRN:  [id number] Principal Diagnosis: <principal problem not specified> Diagnosis:  Active Problems:   Substance induced mood disorder (HCC)   Marijuana abuse, continuous   Schizophrenia (HCC)   Bipolar affective (HCC)   Total Time spent with patient: 20 minutes  Subjective:   Andres Mccall is a 25 y.o. male patient  with history of schizophrenia, bipolar affective disorder, substance induced mood, and marijuana abuse who presented to [facility] via IVC. Patient presented responding to external/internal stimuli and hallucinations with some agitation. Today he states he is in the hospital because, "My mom put me in here".  Andres Mccall, 1 y.o., male patient seen face to face  by this provider, consulted with Dr. Waldron Labs; and chart reviewed on 01/05/21.    HPI:   During evaluation Andres Mccall is laying in the bed in no acute distress.  He is sleepy. Had to arouse patient during assessment. He did not make any eye contact, he kept his eyes shut. Speech is clear and not pressured. He is alert, oriented x 4, calm and cooperative. Denies depression. States, "I just want to go home".  He is affect is flat. He is tangential and illogical. He does not appear to be responding to internal/external stimuli or delusional thoughts, but he was sleepy and short with most of the questions. It was hard to assess.  Patient denies suicidal/self-harm/homicidal ideation, psychosis, and paranoia.  Patient denies any auditory or visual hallucinations Patient began to get agitated when asking about going home. States he does not need to be in the hospital.  Contacted pharmacy per family request to inquire about Abilify injection.  Pharmacist states we do not have nor give the Abilify injection.  Patient will have to receive injection in outpatient setting.  RN states that  patient has been anxious, agitated at times. Reports he has been compliant with medications and is eating well. States patient did not sleep last night.   Past Psychiatric History: schizophrenia, bipolar affective, substance induced mood, marijuana use  Risk to Self:  pt denies Risk to Others:  pt denies Prior Inpatient Therapy:  yes Prior Outpatient Therapy:  yes  Past Medical History:  Past Medical History:  Diagnosis Date  . Bipolar affective (HCC)   . Schizophrenia (HCC)    No past surgical history on file. Family History: No family history on file. Family Psychiatric  History: unknown  Social History:  Social History   Substance and Sexual Activity  Alcohol Use Not on file     Social History   Substance and Sexual Activity  Drug Use Not on file    Social History   Socioeconomic History  . Marital status: Single    Spouse name: Not on file  . Number of children: Not on file  . Years of education: Not on file  . Highest education level: Not on file  Occupational History  . Not on file  Tobacco Use  . Smoking status: Not on file  . Smokeless tobacco: Not on file  Substance and Sexual Activity  . Alcohol use: Not on file  . Drug use: Not on file  . Sexual activity: Not on file  Other Topics Concern  . Not on file  Social History Narrative  . Not on file   Social Determinants of Health   Financial Resource Strain: Not on file  Food Insecurity: Not on  file  Transportation Needs: Not on file  Physical Activity: Not on file  Stress: Not on file  Social Connections: Not on file   Additional Social History:    Allergies:  No Known Allergies  Labs: No results found for this or any previous visit (from the past 48 hour(s)).  Current Facility-Administered Medications  Medication Dose Route Frequency Provider Last Rate Last Admin  . acetaminophen (TYLENOL) tablet 1,000 mg  1,000 mg Oral Q6H PRN Pricilla Loveless, MD   1,000 mg at 01/04/21 1443  .  acetaminophen (TYLENOL) tablet 650 mg  650 mg Oral Once Lorre Nick, MD      . ARIPiprazole (ABILIFY) tablet 20 mg  20 mg Oral Daily Mancel Bale, MD   20 mg at 01/05/21 0846  . ARIPiprazole ER (ABILIFY MAINTENA) 400 MG prefilled syringe 400 mg  400 mg Intramuscular Once Toy Cookey E, NP      . ARIPiprazole ER (ABILIFY MAINTENA) 400 MG prefilled syringe 400 mg  400 mg Intramuscular Once Toy Cookey E, NP      . hydrOXYzine (ATARAX/VISTARIL) tablet 25 mg  25 mg Oral TID PRN Mancel Bale, MD   25 mg at 01/05/21 1331  . lip balm (CARMEX) ointment   Topical PRN Lorre Nick, MD   Given at 01/05/21 1332  . menthol-cetylpyridinium (CEPACOL) lozenge 3 mg  1 lozenge Oral PRN Melene Plan, DO   3 mg at 01/03/21 0402  . mirtazapine (REMERON) tablet 15 mg  15 mg Oral QHS Mancel Bale, MD   15 mg at 01/04/21 2130  . nicotine (NICODERM CQ - dosed in mg/24 hours) patch 21 mg  21 mg Transdermal Daily Pollyann Savoy, MD   21 mg at 01/05/21 0845  . OLANZapine zydis (ZYPREXA) disintegrating tablet 10 mg  10 mg Oral BID Vernard Gambles H, NP      . OLANZapine zydis (ZYPREXA) disintegrating tablet 5 mg  5 mg Oral Q8H PRN Leevy-Johnson, Brooke A, NP   5 mg at 01/05/21 0320  . polyvinyl alcohol (LIQUIFILM TEARS) 1.4 % ophthalmic solution 1 drop  1 drop Both Eyes PRN Leevy-Johnson, Brooke A, NP   1 drop at 01/04/21 1028   Current Outpatient Medications  Medication Sig Dispense Refill  . Ascorbic Acid (VITAMIN C PO) Take 1 tablet by mouth daily.    . ASHWAGANDHA PO Take 1 tablet by mouth daily.    Marland Kitchen ELDERBERRY PO Take 1 capsule by mouth daily.    Marland Kitchen ibuprofen (ADVIL) 200 MG tablet Take 600-800 mg by mouth every 6 (six) hours as needed for fever, headache or mild pain.    . polyvinyl alcohol (LIQUIFILM TEARS) 1.4 % ophthalmic solution Place 1 drop into both eyes as needed for dry eyes.    . hydrOXYzine (ATARAX/VISTARIL) 10 MG tablet Take 1 tablet (10 mg total) by mouth 3 (three) times daily as  needed. 90 tablet 2  . mirtazapine (REMERON) 15 MG tablet Take 1 tablet (15 mg total) by mouth at bedtime. 30 tablet 2    Musculoskeletal: Strength & Muscle Tone: within normal limits Gait & Station: normal Patient leans: N/A   Psychiatric Specialty Exam:  Presentation  General Appearance: Disheveled  Eye Contact:Fleeting  Speech:Clear and Coherent; Normal Rate  Speech Volume:Normal  Handedness:Right   Mood and Affect  Mood:Depressed  Affect:Congruent   Thought Process  Thought Processes:Coherent  Descriptions of Associations:Tangential  Orientation:Full (Time, Place and Person)  Thought Content:Tangential; Illogical  History of Schizophrenia/Schizoaffective disorder:Yes  Duration of Psychotic Symptoms:No  data recorded Hallucinations:Hallucinations: None  Ideas of Reference:None  Suicidal Thoughts:Suicidal Thoughts: No  Homicidal Thoughts:Homicidal Thoughts: No   Sensorium  Memory:Immediate Fair; Recent Fair; Remote Fair  Judgment:Poor  Insight:Lacking   Executive Functions  Concentration:Poor  Attention Span:Poor  Recall:Poor  Fund of Knowledge:Fair  Language:Fair   Psychomotor Activity  Psychomotor Activity:Psychomotor Activity: Normal   Assets  Assets:Financial Resources/Insurance; Housing; Leisure Time; Physical Health; Resilience; Social Support   Sleep  Sleep:Sleep: Poor Number of Hours of Sleep: 0   Physical Exam: Physical Exam HENT:     Head: Normocephalic.     Right Ear: Ear canal normal.     Left Ear: Ear canal normal.  Eyes:     Conjunctiva/sclera: Conjunctivae normal.  Cardiovascular:     Rate and Rhythm: Normal rate.  Pulmonary:     Effort: No respiratory distress.  Musculoskeletal:        General: Normal range of motion.     Cervical back: Normal range of motion.  Skin:    General: Skin is warm.  Neurological:     Mental Status: He is alert and oriented to person, place, and time.  Psychiatric:         Attention and Perception: He is inattentive. He does not perceive auditory or visual hallucinations.        Mood and Affect: Mood is depressed.        Speech: Speech normal.        Behavior: Behavior is cooperative.        Thought Content: Thought content does not include homicidal or suicidal ideation.        Cognition and Memory: Cognition normal.        Judgment: Judgment is impulsive.    Review of Systems  Constitutional: Negative.   HENT: Negative.  Negative for hearing loss.   Respiratory: Negative.   Cardiovascular: Negative.  Negative for leg swelling.  Musculoskeletal: Negative.   Skin: Negative.   Neurological: Negative.   Psychiatric/Behavioral: Positive for depression.   Blood pressure 119/73, pulse 95, temperature 98.2 F (36.8 C), temperature source Oral, resp. rate 20, height 6\' 2"  (1.88 m), weight 70.3 kg, SpO2 99 %. Body mass index is 19.9 kg/m.   Treatment Plan Summary:  Patient continues to meet inpatient criteria.  Daily contact with patient to assess and evaluate symptoms and progress in treatment and Medication management  Case consulted with Dr. :  Zyprexa increased from 2.5 mg PO BID to Zyprexa 10 mg PO BID Agitation protocol added   Disposition:  Patient continues to meet inpatient psychiatric admission.   Bronwen Betters, NP 01/05/2021 3:43 PM

## 2021-01-06 MED ORDER — METHOCARBAMOL 500 MG PO TABS
500.0000 mg | ORAL_TABLET | Freq: Once | ORAL | Status: AC
  Administered 2021-01-06: 500 mg via ORAL
  Filled 2021-01-06: qty 1

## 2021-01-06 MED ORDER — LORAZEPAM 1 MG PO TABS
1.0000 mg | ORAL_TABLET | Freq: Once | ORAL | Status: AC
  Administered 2021-01-06: 1 mg via ORAL
  Filled 2021-01-06 (×2): qty 1

## 2021-01-06 MED ORDER — ARIPIPRAZOLE ER 400 MG IM SRER
400.0000 mg | Freq: Once | INTRAMUSCULAR | Status: AC
  Administered 2021-01-06: 400 mg via INTRAMUSCULAR

## 2021-01-06 MED ORDER — HYDROXYZINE HCL 25 MG PO TABS: 25.0000 mg | ORAL_TABLET | Freq: Three times a day (TID) | ORAL | 0 refills | Status: DC | PRN

## 2021-01-06 MED ORDER — OLANZAPINE 5 MG PO TBDP: 5.0000 mg | ORAL_TABLET | Freq: Two times a day (BID) | ORAL | 0 refills | Status: DC

## 2021-01-06 MED ORDER — ARIPIPRAZOLE 20 MG PO TABS: 20.0000 mg | ORAL_TABLET | Freq: Every day | ORAL | 0 refills | Status: DC

## 2021-01-06 NOTE — ED Provider Notes (Signed)
Emergency Medicine Observation Re-evaluation Note  Andres Mccall is a 25 y.o. male, seen on rounds today.  Pt initially presented to the ED for complaints of Psychiatric Evaluation (Emergency IVC) Currently, the patient is laying in bed.  Physical Exam  BP 127/84 (BP Location: Right Arm)   Pulse 76   Temp 97.6 F (36.4 C) (Oral)   Resp 18   Ht 6\' 2"  (1.88 m)   Wt 70.3 kg   SpO2 99%   BMI 19.90 kg/m  Physical Exam General: NAD Cardiac: normal rate Lungs: no increased WOB Psych: calm  ED Course / MDM  EKG:EKG Interpretation  Date/Time:  Thursday Dec 31 2020 14:19:56 EDT Ventricular Rate:  75 PR Interval:  118 QRS Duration: 84 QT Interval:  356 QTC Calculation: 397 R Axis:   95 Text Interpretation: Normal sinus rhythm Rightward axis Borderline ECG No old tracing to compare Confirmed by [birth year]31-1983 [phone]) on 12/31/2020 3:45:43 PM   I have reviewed the labs performed to date as well as medications administered while in observation.  Recent changes in the last 24 hours include none.  Plan  Current plan is for awaiting placement. Patient is under full IVC at this time.   01/02/2021, MD 01/06/21 [phone]

## 2021-01-06 NOTE — BH Assessment (Signed)
[facility] Assessment Progress Note  Per Vernard Gambles, NP, this pt does not require psychiatric hospitalization at this time.  Pt presents under IVC initiated by law enforcement on 5/25 and upheld by EDP Kennis Carina, MD, which expire tonight at 23:00.  Pt is psychiatrically cleared.  He has agreed to receive an Abilify injection before being discharge.  Discharge instructions include an appointment at [facility] on Wednesday, 01/27/2021 at 08:00.  EDP Mancel Bale, MD and pt's nurse, Addison Naegeli, have been notified.  Doylene Canning, MA Triage Specialist [phone]
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
## 2021-01-06 NOTE — Discharge Instructions (Signed)
For your behavioral health needs, you are advised to continue treatment at [facility].  Your next appointment is scheduled for Wednesday, January 27, 2021 at 8:00 am:       [facility]      [address]      [place], [place] [address]      [phone]

## 2021-01-06 NOTE — Progress Notes (Signed)
01/06/2021  1547  Patient states he does not want to wait one and a half hours for his mom to come. Pt states he has money to [place] home.

## 2021-01-27 ENCOUNTER — Encounter (HOSPITAL_COMMUNITY): Payer: PRIVATE HEALTH INSURANCE | Admitting: Psychiatry

## 2021-02-10 ENCOUNTER — Ambulatory Visit (HOSPITAL_COMMUNITY): Payer: No Payment, Other | Admitting: *Deleted

## 2021-02-10 ENCOUNTER — Telehealth (HOSPITAL_COMMUNITY): Payer: Self-pay | Admitting: *Deleted

## 2021-02-10 ENCOUNTER — Other Ambulatory Visit (HOSPITAL_COMMUNITY): Payer: Self-pay | Admitting: Psychiatry

## 2021-02-10 ENCOUNTER — Other Ambulatory Visit: Payer: Self-pay

## 2021-02-10 ENCOUNTER — Encounter (HOSPITAL_COMMUNITY): Payer: Self-pay

## 2021-02-10 ENCOUNTER — Ambulatory Visit (HOSPITAL_COMMUNITY)
Admission: EM | Admit: 2021-02-10 | Discharge: 2021-02-10 | Disposition: A | Discharge status: Home / Self Care | Payer: PRIVATE HEALTH INSURANCE

## 2021-02-10 ENCOUNTER — Inpatient Hospital Stay
Admit: 2021-02-10 | Discharge: 2021-02-10 | Disposition: A | Discharge status: Home / Self Care | Payer: PRIVATE HEALTH INSURANCE | Attending: Emergency Medicine

## 2021-02-10 VITALS — BP 109/72 | HR 83 | Ht 72.0 in | Wt 166.0 lb

## 2021-02-10 DIAGNOSIS — F1210 Cannabis abuse, uncomplicated: Secondary | ICD-10-CM | POA: Diagnosis not present

## 2021-02-10 DIAGNOSIS — F209 Schizophrenia, unspecified: Secondary | ICD-10-CM

## 2021-02-10 DIAGNOSIS — F1994 Other psychoactive substance use, unspecified with psychoactive substance-induced mood disorder: Secondary | ICD-10-CM

## 2021-02-10 MED ORDER — ARIPIPRAZOLE ER 400 MG IM PRSY
400.0000 mg | PREFILLED_SYRINGE | Freq: Once | INTRAMUSCULAR | Status: AC
  Administered 2021-02-10: 400 mg via INTRAMUSCULAR

## 2021-02-10 MED ORDER — HYDROXYZINE HCL 50 MG PO TABS: 25.0000 mg | ORAL_TABLET | Freq: Three times a day (TID) | ORAL | 2 refills | Status: DC | PRN

## 2021-02-10 MED ORDER — ARIPIPRAZOLE ER 400 MG IM PRSY: 400.0000 mg | PREFILLED_SYRINGE | Freq: Once | INTRAMUSCULAR | Status: DC

## 2021-02-10 MED ORDER — ARIPIPRAZOLE 5 MG TAB
5 mg | ORAL | Status: AC
Start: 2021-02-10 — End: 2021-02-10
  Administered 2021-02-10: 06:00:00 via ORAL

## 2021-02-10 MED ORDER — ARIPIPRAZOLE 20 MG TAB
20 mg | ORAL | Status: AC
Start: 2021-02-10 — End: 2021-02-10

## 2021-02-10 MED ORDER — ARIPIPRAZOLE 20 MG TAB
20 mg | ORAL_TABLET | Freq: Every day | ORAL | 0 refills | Status: AC
Start: 2021-02-10 — End: ?

## 2021-02-10 MED ORDER — IBUPROFEN 600 MG TAB
600 mg | ORAL | Status: AC
Start: 2021-02-10 — End: 2021-02-10
  Administered 2021-02-10: 07:00:00 via ORAL

## 2021-02-10 MED ORDER — IBUPROFEN 600 MG TAB
600 mg | ORAL | Status: DC
Start: 2021-02-10 — End: 2021-02-10
  Administered 2021-02-10: 07:00:00 via ORAL

## 2021-02-10 MED FILL — ARIPIPRAZOLE 5 MG TAB: 5 mg | ORAL | Qty: 4

## 2021-02-10 MED FILL — ARIPIPRAZOLE 20 MG TAB: 20 mg | ORAL | Qty: 1

## 2021-02-10 MED FILL — IBUPROFEN 600 MG TAB: 600 mg | ORAL | Qty: 1
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
## 2021-02-10 NOTE — Discharge Instructions (Addendum)

## 2021-02-10 NOTE — Telephone Encounter (Signed)
Provider spoke to patient today.  He notes that he has been more anxious lately.  He notes he has been taking more hydroxyzine as prescribed.  He asked provider for Ativan or Xanax.  Provider notes that those medications would not be given today.  He was agreeable to increasing hydroxyzine 25 mg 3 times a day to 50 mg 3 times a day as needed.  Patient also informed Clinical research associate that he has been taking Kanna herb to help manage his anxiety.  Provider informed patient that she had not heard of this her.  Provider also informed patient that she reviewed the FDA website and did not find that it was listed.  He informed Clinical research associate that it is somewhat effective and plans to continue it.  Provider encouraged patient to take his medications as prescribed and not take herbs that was not approved by the FDA.  He endorsed understanding however disagreed.  Patient notes that he has not have any side effects of Abilify and was agreeable to receiving his Abilify Maintena injection today.  Patient's mother notes that patient needs therapy.  Provider asked patient if he wants to follow-up with a therapist.  At this time he notes that he does not.  No other concerns noted at this time.

## 2021-02-10 NOTE — Telephone Encounter (Signed)
Mom and patient called this am, he is needing his oral and im meds. He was discharged from the hospital 3 weeks ago, and hasnt had a shot since may. He is hallucinating and getting agitated. He is to come in today for his shot but will have to return to see a dr [place] for his oral meds.

## 2021-02-10 NOTE — ED Notes (Signed)
PT decided that he would walk home upon d/c vs waiting for [employer] to pick him up and take him back home.
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
## 2021-02-10 NOTE — Telephone Encounter (Signed)
In for injection today, seen initially by provider to assess his psych need and provide an order for todays injection. He was in the hospital recently and did not follow up once he was discharged. His mom called this am to ask if he could get his shot today.

## 2021-02-10 NOTE — Progress Notes (Signed)
Patient ID: Andres Mccall, male   DOB: 1995/12/02, 25 y.o.   MRN: [id number] Mom of Andres Mccall had called this am to ask if she could bring him in as he is late for his shot and did not follow up after being discharged from the hospital. She states when she called he was hearing voices and acting up. Today he was pleasant, low energy, nice manners, and smells of marijuana. He is distracted and vague with answers but answers are appropriate. Toy Cookey DNP saw him first before his injection was given to provide the order for his shot and assess him. He got Abilify M 400 mg in his R DELTOID without difficulty. He is to return in two weeks to see Dr Doyne Keel and One month for his next shot. Completed Patient assistance paper work for his Abilify going forward.

## 2021-02-10 NOTE — ED Notes (Signed)
I have reviewed discharge instructions with the patient and parent.  The patient and parent verbalized understanding. Patient escorted to waiting room with mother, Gait steady

## 2021-02-10 NOTE — ED Provider Notes (Signed)
ED Provider Notes by Jarvis Newcomer, DO at 02/10/21 0206                Author: Jarvis Newcomer, DO  Service: --  Author Type: Physician       Filed: 02/10/21 0241  Date of Service: 02/10/21 0206  Status: Addendum          Editor: Jarvis Newcomer, DO (Physician)          Related Notes: Original Note by Jarvis Newcomer, DO (Physician) filed at 02/10/21 [phone]               EMERGENCY DEPARTMENT HISTORY AND PHYSICAL EXAM           Date: 02/10/2021   Patient Name: Tanner Ramos        History of Presenting Illness          Chief Complaint       Patient presents with        ?  Medication Refill           History Provided By: Patient      HPI: Tanner Ramos,  25 y.o. male with a significant past medical history of schizophrenia, bipolar disorder  presents to the ED with request for medication refill.  He is followed by psychiatry, typically gets Abilify 400 mg IM monthly, when unable he takes 20 mg of Abilify daily.  He ran out of medication 1 month ago, has an appointment on 7/25 with his psychiatrist.   He is here today with his mother and grandmother, they report that his behavior today has become more erratic, he is pacing, admits to hallucinations.  He reports no suicidal intent, no intent or plan to harm his family members.  He has taken a leftover  olanzapine to try to blunt his mood, has not been effective.      There are no other complaints, changes, or physical findings at this time.      PCP: Selena Batten, MD        No current facility-administered medications on file prior to encounter.          Current Outpatient Medications on File Prior to Encounter          Medication  Sig  Dispense  Refill           ?  ARIPiprazole (Abilify) 20 mg tablet  Take 20 mg by mouth daily.         ?  hydrOXYzine HCL (ATARAX) 25 mg tablet  Take  by mouth three (3) times daily as needed.         ?  OLANZapine (ZyPREXA) 5 mg tablet  Take  by mouth nightly as needed.               ?  [DISCONTINUED] ARIPiprazole  (ABILIFY MAINTENA) 400 mg injection  400 mg by IntraMUSCular route every twenty-eight (28) days.                 Past History        Past Medical History:     Past Medical History:        Diagnosis  Date         ?  Anxiety       ?  Bipolar 1 disorder (HCC)       ?  Psychiatric disorder           ?  Schizophrenia (HCC)  Past Surgical History:   History reviewed. No pertinent surgical history.      Family History:   History reviewed. No pertinent family history.      Social History:     Social History          Tobacco Use         ?  Smoking status:  Current Every Day Smoker              Packs/day:  0.50         ?  Smokeless tobacco:  Never Used       Substance Use Topics         ?  Alcohol use:  Yes     ?  Drug use:  Yes             Comment: Kanna           Allergies:     Allergies        Allergen  Reactions         ?  Gentamicin  Unknown (comments)             Review of Systems     Review of Systems    Constitutional: Negative for chills and fever.    HENT: Negative for ear pain, rhinorrhea, sneezing and sore throat.     Eyes: Negative for pain and discharge.    Respiratory: Negative for cough, shortness of breath and wheezing.     Cardiovascular: Negative for chest pain.    Gastrointestinal: Negative for abdominal pain, constipation, diarrhea, nausea and vomiting.    Endocrine: Negative for polydipsia and polyphagia.    Genitourinary: Negative for dysuria, flank pain, frequency, hematuria and urgency.    Musculoskeletal: Negative for arthralgias, back pain, joint swelling and neck pain.    Skin: Negative for rash.    Neurological: Negative for dizziness, weakness, light-headedness, numbness and headaches.    Hematological: Negative for adenopathy.    Psychiatric/Behavioral: Negative for agitation, behavioral problems and self-injury.    All other systems reviewed and are negative.           Physical Exam     Physical Exam   Vitals reviewed.    Constitutional:        General: He is not in acute distress.      Appearance: Normal appearance.   Cardiovascular:       Rate and Rhythm: Normal rate and regular rhythm.      Heart sounds: Normal heart sounds.    Pulmonary:       Effort: Pulmonary effort is normal.      Breath sounds: Normal breath sounds.   Abdominal :      General: Abdomen is flat.      Palpations: Abdomen is soft.     Musculoskeletal:      Cervical back: Normal range of motion.     Neurological:       Mental Status: He is alert.   Psychiatric :         Attention and Perception: Attention normal. He perceives auditory hallucinations.         Speech: Speech is  delayed.         Behavior: Behavior normal. Behavior is cooperative.      Comments: Patient is answering questions appropriately  in the emergency department.  A little delayed, soft speech.  No agitation or aggression is noted.  Lab and Diagnostic Study Results     Labs -    No results found for this or any previous visit (from the past 12 hour(s)).      Radiologic Studies -    @lastxrresult @     CT Results   (Last 48 hours)          None                 CXR Results   (Last 48 hours)          None                    Medical Decision Making and ED Course     - I am the first provider for this patient.  I reviewed the vital signs, available nursing notes, past medical history, past surgical history, family history and social history.      - Initial assessment performed. The patients presenting problems have been discussed, and they are in agreement with the care plan formulated and outlined with them.  I have encouraged them to ask questions as they arise throughout their visit.      Vital Signs-Reviewed the patient's vital signs.   Patient Vitals for the past 12 hrs:            Temp  Pulse  Resp  BP  SpO2            02/10/21 0208  98.4 ??F (36.9 ??C)  (!) 105  18  (!) 147/87  100 %           Differential Diagnosis & Medical Decision Making Provider Note:    Patient is seen and evaluated shortly after arrival, his mother and grandmother present  throughout interview and exam, they do assist with some history.  Patient is answering questions slowly but appropriately.   Orts having hallucinations today that are not harmful, he has been pacing, had increased agitation.  He reports no intent to harm himself or family members.   Differential diagnosis is reviewed and discussed, to include schizophrenic exacerbation, bipolar disorder, auditory/visual hallucinations, medication noncompliance, other psychiatric disorder.   Patient and family are requesting a dose of Abilify in the emergency department and prescription.  None of the family feel that he needs to be admitted to behavioral medicine, he denies intent to harm himself or others.  He is given Abilify 20 mg p.o.,  tolerated well and without difficulty, prescription is given, contact information is given for [facility].   MDM       ED Course:             Procedures     Performed by: Jarvis Newcomeravid H Smokey Melott, DO   Procedures          Disposition     Disposition: Condition stable   DC- Adult Discharges: All of the diagnostic tests were reviewed and questions answered. Diagnosis, care plan and treatment options were discussed.  The patient understands the instructions and will follow up as directed. The patients results have been  reviewed with them.  They have been counseled regarding their diagnosis.  The patient and parent verbally convey understanding and agreement of the signs, symptoms, diagnosis, treatment and prognosis and additionally agrees to follow up as recommended  with their PCP in 24 - 48 hours.  They also agree with the care-plan and convey that all of their questions have been answered.  I have also put together some discharge instructions  for them that include: 1) educational information regarding their diagnosis,  2) how to care for their diagnosis at home, as well a 3) list of reasons why they would want to return to the ED prior to their follow-up appointment, should their  condition change.      Schizophrenia   DISCHARGE PLAN:   1. There are no discharge medications for this patient.      2.      Follow-up Information               Follow up With  Specialties  Details  Why  Contact Info              [facility]    Go in 1 day    [address] [place] [address]   [phone]             3.  Return to ED if worse    4.      Current Discharge Medication List              START taking these medications          Details        !! ARIPiprazole (ABILIFY) 20 mg tablet  Take 1 Tablet by mouth daily.   Qty: 20 Tablet, Refills:  0   Start date: 02/10/2021               !! - Potential duplicate medications found. Please discuss with provider.              CONTINUE these medications which have NOT CHANGED          Details        !! ARIPiprazole (Abilify) 20 mg tablet  Take 20 mg by mouth daily.               !! - Potential duplicate medications found. Please discuss with provider.                       Diagnosis/Clinical Impression        Clinical Impression:       1.  Schizophrenia, unspecified type (HCC)         2.  Bipolar affective disorder, remission status unspecified (HCC)            Attestations: Jarvis Newcomer, DO      Please note that this dictation was completed with Dragon, the computer voice recognition software.  Quite often unanticipated grammatical, syntax, homophones, and other interpretive errors are inadvertently  transcribed by the computer software.  Please disregard these errors.  Please excuse any errors that have escaped final proofreading.  Thank you.

## 2021-02-10 NOTE — ED Notes (Signed)
Patient and mother reports patient is out of his monthly IM Abilify and his back up daily Abilify pills. Request medication refill.

## 2021-02-10 NOTE — ED Notes (Signed)
Patient reports headache. Dr. Amparo Bristol made aware and orders given for Ibuprofen.

## 2021-02-17 ENCOUNTER — Encounter (HOSPITAL_COMMUNITY): Payer: PRIVATE HEALTH INSURANCE | Admitting: Psychiatry

## 2021-02-20 ENCOUNTER — Emergency Department
Admission: EM | Admit: 2021-02-20 | Discharge: 2021-02-20 | Disposition: A | Discharge status: Home / Self Care | Payer: PRIVATE HEALTH INSURANCE | Attending: Emergency Medicine | Admitting: Emergency Medicine

## 2021-02-20 ENCOUNTER — Other Ambulatory Visit: Payer: Self-pay

## 2021-02-20 ENCOUNTER — Emergency Department: Payer: PRIVATE HEALTH INSURANCE

## 2021-02-20 DIAGNOSIS — M79674 Pain in right toe(s): Secondary | ICD-10-CM | POA: Insufficient documentation

## 2021-02-20 DIAGNOSIS — M545 Low back pain, unspecified: Secondary | ICD-10-CM | POA: Insufficient documentation

## 2021-02-20 DIAGNOSIS — Y92410 Unspecified street and highway as the place of occurrence of the external cause: Secondary | ICD-10-CM | POA: Insufficient documentation

## 2021-02-20 DIAGNOSIS — M542 Cervicalgia: Secondary | ICD-10-CM | POA: Insufficient documentation

## 2021-02-20 MED ORDER — ORPHENADRINE CITRATE ER 100 MG PO TB12: 100.0000 mg | ORAL_TABLET | Freq: Two times a day (BID) | ORAL | 0 refills | Status: DC

## 2021-02-20 MED ORDER — NAPROXEN 500 MG PO TABS
500.0000 mg | ORAL_TABLET | Freq: Once | ORAL | Status: AC
  Administered 2021-02-20: 500 mg via ORAL
  Filled 2021-02-20: qty 1

## 2021-02-20 MED ORDER — NAPROXEN 500 MG PO TABS: 500.0000 mg | ORAL_TABLET | Freq: Two times a day (BID) | ORAL | 0 refills | Status: DC

## 2021-02-20 MED ORDER — CYCLOBENZAPRINE HCL 10 MG PO TABS
10.0000 mg | ORAL_TABLET | Freq: Once | ORAL | Status: AC
  Administered 2021-02-20: 10 mg via ORAL
  Filled 2021-02-20: qty 1
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
## 2021-02-20 NOTE — Discharge Instructions (Addendum)
No acute findings on x-ray of the neck, back, and right toe.  Read and follow discharge care instructions.  Take medication as directed.
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
## 2021-02-21 ENCOUNTER — Emergency Department (HOSPITAL_COMMUNITY)
Admission: EM | Admit: 2021-02-21 | Discharge: 2021-03-01 | Disposition: A | Discharge status: Home / Self Care | Payer: PRIVATE HEALTH INSURANCE | Attending: Emergency Medicine | Admitting: Emergency Medicine

## 2021-02-21 ENCOUNTER — Encounter (HOSPITAL_COMMUNITY): Payer: Self-pay

## 2021-02-21 ENCOUNTER — Emergency Department (HOSPITAL_COMMUNITY): Payer: PRIVATE HEALTH INSURANCE

## 2021-02-21 DIAGNOSIS — F419 Anxiety disorder, unspecified: Secondary | ICD-10-CM | POA: Diagnosis not present

## 2021-02-21 DIAGNOSIS — F209 Schizophrenia, unspecified: Secondary | ICD-10-CM | POA: Diagnosis present

## 2021-02-21 DIAGNOSIS — M79641 Pain in right hand: Secondary | ICD-10-CM | POA: Diagnosis not present

## 2021-02-21 DIAGNOSIS — F250 Schizoaffective disorder, bipolar type: Secondary | ICD-10-CM | POA: Diagnosis present

## 2021-02-21 DIAGNOSIS — R456 Violent behavior: Secondary | ICD-10-CM | POA: Diagnosis not present

## 2021-02-21 DIAGNOSIS — F1999 Other psychoactive substance use, unspecified with unspecified psychoactive substance-induced disorder: Secondary | ICD-10-CM | POA: Diagnosis not present

## 2021-02-21 DIAGNOSIS — M549 Dorsalgia, unspecified: Secondary | ICD-10-CM | POA: Insufficient documentation

## 2021-02-21 DIAGNOSIS — F203 Undifferentiated schizophrenia: Secondary | ICD-10-CM | POA: Diagnosis not present

## 2021-02-21 DIAGNOSIS — R45850 Homicidal ideations: Secondary | ICD-10-CM

## 2021-02-21 DIAGNOSIS — Z046 Encounter for general psychiatric examination, requested by authority: Secondary | ICD-10-CM | POA: Diagnosis present

## 2021-02-21 DIAGNOSIS — F319 Bipolar disorder, unspecified: Secondary | ICD-10-CM | POA: Diagnosis not present

## 2021-02-21 DIAGNOSIS — Z20822 Contact with and (suspected) exposure to covid-19: Secondary | ICD-10-CM | POA: Insufficient documentation

## 2021-02-21 DIAGNOSIS — F1994 Other psychoactive substance use, unspecified with psychoactive substance-induced mood disorder: Secondary | ICD-10-CM | POA: Diagnosis present

## 2021-02-21 DIAGNOSIS — F1210 Cannabis abuse, uncomplicated: Secondary | ICD-10-CM | POA: Diagnosis present

## 2021-02-21 LAB — RAPID URINE DRUG SCREEN, HOSP PERFORMED
Amphetamines: NOT DETECTED
Barbiturates: NOT DETECTED
Benzodiazepines: NOT DETECTED
Cocaine: POSITIVE — AB
Opiates: NOT DETECTED
Tetrahydrocannabinol: POSITIVE — AB

## 2021-02-21 LAB — CBC
HCT: 47 % (ref 39.0–52.0)
Hemoglobin: 15.7 g/dL (ref 13.0–17.0)
MCH: 31.5 pg (ref 26.0–34.0)
MCHC: 33.4 g/dL (ref 30.0–36.0)
MCV: 94.2 fL (ref 80.0–100.0)
Platelets: 205 10*3/uL (ref 150–400)
RBC: 4.99 MIL/uL (ref 4.22–5.81)
RDW: 12.3 % (ref 11.5–15.5)
WBC: 6.5 10*3/uL (ref 4.0–10.5)
nRBC: 0 % (ref 0.0–0.2)

## 2021-02-21 LAB — COMPREHENSIVE METABOLIC PANEL
ALT: 12 U/L (ref 0–44)
AST: 27 U/L (ref 15–41)
Albumin: 4.7 g/dL (ref 3.5–5.0)
Alkaline Phosphatase: 66 U/L (ref 38–126)
Anion gap: 8 (ref 5–15)
BUN: 11 mg/dL (ref 6–20)
CO2: 25 mmol/L (ref 22–32)
Calcium: 9.5 mg/dL (ref 8.9–10.3)
Chloride: 105 mmol/L (ref 98–111)
Creatinine, Ser: 0.8 mg/dL (ref 0.61–1.24)
GFR, Estimated: >60 mL/min (ref >60)
Glucose, Bld: 77 mg/dL (ref 70–99)
Potassium: 4.1 mmol/L (ref 3.5–5.1)
Sodium: 138 mmol/L (ref 135–145)
Total Bilirubin: 0.5 mg/dL (ref 0.3–1.2)
Total Protein: 7.8 g/dL (ref 6.5–8.1)

## 2021-02-21 LAB — ETHANOL: Alcohol, Ethyl (B): <10 mg/dL (ref <10)

## 2021-02-21 LAB — SALICYLATE LEVEL: Salicylate Lvl: <7 mg/dL — ABNORMAL LOW (ref 7.0–30.0)

## 2021-02-21 LAB — ACETAMINOPHEN LEVEL: Acetaminophen (Tylenol), Serum: <10 ug/mL — ABNORMAL LOW (ref 10–30)

## 2021-02-21 MED ORDER — ZIPRASIDONE MESYLATE 20 MG IM SOLR
20.0000 mg | INTRAMUSCULAR | Status: AC | PRN
  Administered 2021-02-24: 20 mg via INTRAMUSCULAR
  Filled 2021-02-21: qty 20

## 2021-02-21 MED ORDER — OLANZAPINE 5 MG PO TBDP
5.0000 mg | ORAL_TABLET | Freq: Three times a day (TID) | ORAL | Status: DC | PRN
  Administered 2021-02-24 – 2021-02-25 (×2): 5 mg via ORAL
  Filled 2021-02-21 (×2): qty 1

## 2021-02-21 MED ORDER — NAPROXEN 500 MG PO TABS
500.0000 mg | ORAL_TABLET | Freq: Two times a day (BID) | ORAL | Status: DC
  Administered 2021-02-21 – 2021-03-01 (×15): 500 mg via ORAL
  Filled 2021-02-21 (×16): qty 1

## 2021-02-21 MED ORDER — LORAZEPAM 1 MG PO TABS
1.0000 mg | ORAL_TABLET | ORAL | Status: AC | PRN
  Administered 2021-02-22: 1 mg via ORAL
  Filled 2021-02-21: qty 1

## 2021-02-21 MED ORDER — OLANZAPINE 5 MG PO TBDP
5.0000 mg | ORAL_TABLET | Freq: Every day | ORAL | Status: DC
  Administered 2021-02-22 – 2021-02-26 (×5): 5 mg via ORAL
  Filled 2021-02-21 (×5): qty 1

## 2021-02-21 MED ORDER — NICOTINE 21 MG/24HR TD PT24
21.0000 mg | MEDICATED_PATCH | Freq: Every day | TRANSDERMAL | Status: DC
  Administered 2021-02-21 – 2021-02-28 (×8): 21 mg via TRANSDERMAL
  Filled 2021-02-21 (×8): qty 1
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
## 2021-02-21 NOTE — ED Notes (Signed)
Pt was wanded by security and cleared
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
## 2021-02-21 NOTE — ED Notes (Signed)
Pt has remained in room watching tv during my shift. No s/s of distress

## 2021-02-21 NOTE — ED Notes (Signed)
Pt belongings in locker #27  
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
## 2021-02-21 NOTE — ED Provider Notes (Signed)
I provided a substantive portion of the care of this patient.  I personally performed the entirety of the medical decision making for this encounter.      25 year old male presents to the IVC due to homicidal ideations.  He denies any SI.  UDS is positive for cocaine and THC.  Suspect drug-induced psychosis.  We will have psychiatry evaluate   Andres Nick, MD 02/21/21 1241

## 2021-02-21 NOTE — Progress Notes (Signed)
Per Carlis Abbott, patient meets criteria for inpatient treatment. There are no available or appropriate beds at [facility] today. CSW faxed referrals to the following facilities for review:  [facility] Orlie Pollen  TTS will continue to seek bed placement.  Crissie Reese, MSW, LCSW-A, LCAS-A Phone: [phone] Disposition/TOC

## 2021-02-21 NOTE — BH Assessment (Signed)
Clinician requested patient's nurse Suzette Battiest, RN) to place the TTS machine in patient's room for his initial assessment.Marland Kitchen

## 2021-02-21 NOTE — ED Notes (Signed)
Pt was making threatening statements to [place] officers en route, but has been cooperative w/ ED staff.

## 2021-02-21 NOTE — ED Notes (Addendum)
Pt moved to room 18 temporarily for TTS consult. Door shut for privacy but blinds left open. Pt calm and cooperative. [employer] still at bedside.

## 2021-02-22 MED ORDER — MENTHOL 3 MG MT LOZG
1.0000 | LOZENGE | OROMUCOSAL | Status: DC | PRN
  Filled 2021-02-22: qty 9

## 2021-02-22 NOTE — BH Assessment (Addendum)
[facility] Assessment Progress Note    Per Dorena Bodo, NP, this pt requires psychiatric hospitalization at this time.  Pt presents under IVC initiated by pt's mother and upheld by EDP Lorre Nick, MD.  [facility] and [facility] are both at capacity at this time.  At the direction of Nelly Rout, MD, this writer has sought placement for pt outside of the [facility] system.  The following facilities have been contacted to seek placement for this pt, with results as noted:  Beds available, information sent, decision pending: [facility]   At capacity: [facility]   Doylene Canning, [place] Behavioral Health Coordinator [phone]
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
## 2021-02-22 NOTE — ED Notes (Signed)
Pt given the phone to make 1/3 daily calls.
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
## 2021-02-22 NOTE — ED Notes (Signed)
Patient's mother Romie Levee) called to check on status of patient.

## 2021-02-22 NOTE — ED Provider Notes (Signed)
Emergency Medicine Observation Re-evaluation Note  Andres Mccall is a 25 y.o. male, seen on rounds today.  Pt initially presented to the ED for complaints of IVC Currently, the patient is sleeping.  Physical Exam  BP 119/83 (BP Location: Left Arm)   Pulse 72   Temp 97.7 F (36.5 C) (Oral)   Resp 16   SpO2 100%  Physical Exam General: NAD Cardiac: normal rate Lungs: no increased WOB Psych: calm, cooperative  ED Course / MDM  EKG:EKG Interpretation  Date/Time:  Sunday February 21 2021 14:42:19 EDT Ventricular Rate:  62 PR Interval:  140 QRS Duration: 94 QT Interval:  394 QTC Calculation: 399 R Axis:   88 Text Interpretation: Normal sinus rhythm Normal ECG When compared with ECG of 12/31/2020, No significant change was found Confirmed by Dione Booze ([id number]) on 02/22/2021 2:06:33 AM  I have reviewed the labs performed to date as well as medications administered while in observation.  Recent changes in the last 24 hours include none.  Plan  Current plan is for awaiting placement. Patient is under full IVC at this time.   Rolan Bucco, MD 02/22/21 [phone]

## 2021-02-23 LAB — RESP PANEL BY RT-PCR (FLU A&B, COVID) ARPGX2
Influenza A by PCR: NEGATIVE
Influenza B by PCR: NEGATIVE
SARS Coronavirus 2 by RT PCR: NEGATIVE

## 2021-02-23 MED ORDER — HYDROXYZINE HCL 25 MG PO TABS
25.0000 mg | ORAL_TABLET | Freq: Three times a day (TID) | ORAL | Status: DC | PRN
  Administered 2021-02-23 – 2021-02-26 (×6): 25 mg via ORAL
  Filled 2021-02-23 (×7): qty 1

## 2021-02-23 NOTE — Care Management (Signed)
  [facility] Medication Assistance Card Name: [name] ID (MRN): [id number] Bin: [id number] RX Group: [id number] Discharge Date: 02/23/2021 Expiration Date: 02/24/2021                                           (must be filled within 7 days of discharge)    Dear   : Andres Mccall  You have been approved to have the prescriptions written by your discharging physician filled through our [facility] (Medication Assistance Through [facility]) program. This program allows for a one-time (no refills) 34-day supply of selected medications for a low copay amount.  The copay is $3.00 per prescription. For instance, if you have one prescription, you will pay $3.00; for two prescriptions, you pay $6.00; for three prescriptions, you pay $9.00; and so on.  Only certain pharmacies are participating in this program with [facility]. You will need to select one of the pharmacies from the attached list and take your prescriptions, this letter, and your photo ID to one of the participating pharmacies.   We are excited that you are able to use the [facility] program to get your medications. These prescriptions must be filled within 7 days of hospital discharge or they will no longer be valid for the [facility] program. Should you have any problems with your prescriptions please contact your case management team member at [phone] for [place]/ [facility].  Thank you, [facility] Health Care Management

## 2021-02-23 NOTE — Care Management (Signed)
ED CM notified by [facility] Counselor concerning Medication Assistance. RNCM noted patient to be uninsured and would be eligible for [facility] Patient enrolled in program and letter placed in Epic  notes under [name] notes. Instructions given to [facility] ED RN to print letter and give to patient to redeem at [facility] with prescriptions. Also updated the daytime [facility] ED CM. No Further ED RN Case Management needs  identified

## 2021-02-23 NOTE — Consult Note (Signed)
Andres Mccall seen by this provider via tele psyche after reassessment by TTS counselor. Patient's mental status much clearer and it was decided that he could be psyche cleared. Girlfriend notified by TTS counselor and she notified patient's mother, who called this Clinical research associate with safety concerns for her grandchildren and people in the community. Mother is extremely upset with decision to discharge and cannot get the grandchildren to safety. Informed patient of the decision to keep him tonight until a better plan could be decided and for safety  planning to be in place.  He hung up the tele cart.   Per ED nurse, Beth there is a concern about IVC paperwork and first exam.

## 2021-02-23 NOTE — ED Notes (Signed)
Gave pt toothbrush for oral care this morning.Told pt he would have to return in once finished with care. He stated understanding. Toothbrush was retrieved and placed inside locker 27 with his other belongings.

## 2021-02-23 NOTE — BH Assessment (Addendum)
[facility] Assessment Progress Note     Per Dorena Bodo, NP, this involuntary pt continues to require psychiatric hospitalization at this time.  [facility] is at capacity at this time; status at [facility] is unknown.  At the direction of Nelly Rout, MD, this writer has sought placement for pt outside of the [facility] system.  The following facilities have been contacted to seek placement for this pt, with results as noted:   Beds available, information sent, decision pending: [facility] (re-referral; apparently lost) [facility]  Unable to reach: [employer] (left message at 15:57) Mannie Stabile (left message at 16:12) Earlene Plater (left message at 16:19)   At capacity: [facility]   Doylene Canning, [place] Health Coordinator [phone]

## 2021-02-23 NOTE — ED Provider Notes (Signed)
Emergency Medicine Observation Re-evaluation Note  Kaydon Husby is a 25 y.o. male, seen on rounds today.  Pt initially presented to the ED for complaints of schizophrenia like behavior Currently, the patient is awake, alert, ambulatory, in no distress.Marland Kitchen  Physical Exam  BP 125/76 (BP Location: Right Arm)   Pulse 75   Temp 98.2 F (36.8 C) (Oral)   Resp 17   SpO2 100%  Physical Exam General: No distress, ambulatory Cardiac: Regular rate and rhythm Lungs: No increased work of breathing Psych: Distracted  ED Course / MDM  EKG:EKG Interpretation  Date/Time:  Sunday February 21 2021 14:42:19 EDT Ventricular Rate:  62 PR Interval:  140 QRS Duration: 94 QT Interval:  394 QTC Calculation: 399 R Axis:   88 Text Interpretation: Normal sinus rhythm Normal ECG When compared with ECG of 12/31/2020, No significant change was found Confirmed by Dione Booze ([id number]) on 02/22/2021 2:06:33 AM  I have reviewed the labs performed to date as well as medications administered while in observation.  Recent changes in the last 24 hours include none.  Plan  Current plan is for behavioral health placement. Patient is under full IVC at this time.   Gerhard Munch, MD 02/23/21 [phone]
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
## 2021-02-24 MED ORDER — STERILE WATER FOR INJECTION IJ SOLN
INTRAMUSCULAR | Status: AC
  Administered 2021-02-24: 10 mL
  Filled 2021-02-24: qty 10

## 2021-02-24 MED ORDER — HYDROXYZINE HCL 25 MG PO TABS
25.0000 mg | ORAL_TABLET | Freq: Once | ORAL | Status: AC
  Administered 2021-02-24: 25 mg via ORAL
  Filled 2021-02-24: qty 1

## 2021-02-24 NOTE — ED Notes (Signed)
Patient standing at door and is becoming increasingly agitated and security stepped in. It appeared that security was agitating patient more. Patient stated if security walked into room he was going to knock him out. Additional security showed up and deescalated the situation. Patient was redirectable at that time. Snack given to patient.

## 2021-02-24 NOTE — ED Notes (Signed)
Pt resting with eyes closed and lights on inside exam room. Equal rise and fall of chest. Sitter outside of exam room. 1:1.

## 2021-02-24 NOTE — BH Assessment (Addendum)
Disposition:   Per [facility] provider Dorena Bodo, RN) on 02/13/2021, patient continues to meet inpatient psychiatric treatment. @2231 , [facility] , RN), no [facility] beds are available. Patient re-faxed to the following faclities for consideration of placement.   Destination  Service Provider Request Status Selected Services Address Phone Fax Patient Preferred  [facility]  Pending - Request Sent N/A [address] [place] [phone] --  [facility]  Pending - Request Sent N/A [address] [place] [phone] --  [facility]  Pending - Request Sent N/A [address] [place] [phone] --  [facility]  Pending - Request Sent N/A [address] [place] [address] [phone] --  [facility]  Pending - Request Sent N/A [address] [place], [place] [phone] --  [facility]  Pending - Request Sent N/A [address] [place] [phone] --  [facility]  Pending - Request Sent N/A [address], [address] [place] [phone] --  [facility]  Pending - Request Sent N/A [address] [facility] [place] [phone] --  [facility]  Pending - Request Sent N/A [address] [place] [phone] --  [facility]  Pending - Request Sent N/A [address] [place] [phone] --  [facility]  Pending - Request Sent N/A [address] [place] [phone] --  [facility]  Pending - Request Sent N/A [address] [place]  [phone] --  [facility]  Pending - Request Sent N/A [address] [place] [phone] --  [facility]  Pending - Request Sent N/A [address] [place] [address] [phone] --  [facility]  Pending - Request Sent N/A [address] [place] [phone] --  [facility]  Pending - Request Sent N/A [address] [place] [address] [place] [phone] --  [facility]  Pending - Request Sent N/A [address] [place] [phone] --  [facility]  Pending - Request Sent N/A [address] [place] [phone] --  [facility]  Pending - Request Sent N/A [phone] [address] [place] [phone] --  [facility]  Pending - Request Sent N/A [address] [place] [phone] --  [facility]  Pending - Request Sent N/A [address] [place] [address] [place] [address] [phone] --  [facility]  Pending - Request Sent N/A [address] [place] [phone] --  [facility]  Pending - Request Sent N/A [address] [place] [phone] --  [facility]  Pending - Request Sent N/A [address] [place] [phone]

## 2021-02-24 NOTE — ED Notes (Signed)
Report received from Gia B., RN.  

## 2021-02-24 NOTE — ED Notes (Signed)
Pt A&O x4. Cooperative and took morning medications without incident. Pt given orange juice as per requested.

## 2021-02-24 NOTE — BH Assessment (Signed)
Reassessment note- Pt presents sitting upright on end of his bed. Pt is alert and oriented. He denies SI, HI and AVH. Pt expressed frustration and then agitation that discharge is not being recommended. Pt requesting PRN med for agitation. NP and RN notified.

## 2021-02-25 DIAGNOSIS — F209 Schizophrenia, unspecified: Secondary | ICD-10-CM

## 2021-02-25 MED ORDER — ZIPRASIDONE MESYLATE 20 MG IM SOLR
20.0000 mg | Freq: Once | INTRAMUSCULAR | Status: AC
  Administered 2021-02-25: 20 mg via INTRAMUSCULAR
  Filled 2021-02-25: qty 20

## 2021-02-25 NOTE — BH Assessment (Addendum)
[facility] Assessment Progress Note   Per Dorena Bodo, NP, this involuntary pt continues to require psychiatric hospitalization at this time.  At the direction of Nelly Rout, MD, this writer has sought placement for pt at facilities outside of the [facility] system.  Pt has been referred to numerous facilities over the past few days but the following facilities have been contacted to seek placement today, with results as noted:  Beds available, information sent, decision pending: [facility]  Declined: [facility] (not in network)   At capacity: [facility] [place] [facility] [place], [place] [id number] Health Coordinator [phone]

## 2021-02-25 NOTE — ED Notes (Signed)
Pt has been redirectable, he states he just wants to leave

## 2021-02-25 NOTE — ED Notes (Signed)
Pt in bed resting. Respirations even and unlabored. °

## 2021-02-25 NOTE — ED Notes (Signed)
Pt is becoming agitated and states he is going to walk out. States he did not do anything to be here.

## 2021-02-25 NOTE — ED Notes (Signed)
Pt became agitated threatened to leave. Meds given per [facility]
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
## 2021-02-26 MED ORDER — NICOTINE POLACRILEX 2 MG MT GUM
2.0000 mg | CHEWING_GUM | OROMUCOSAL | Status: DC | PRN
  Administered 2021-02-26: 2 mg via ORAL
  Filled 2021-02-26: qty 1

## 2021-02-26 MED ORDER — OLANZAPINE 10 MG PO TBDP
10.0000 mg | ORAL_TABLET | Freq: Three times a day (TID) | ORAL | Status: DC | PRN
  Administered 2021-02-27: 10 mg via ORAL
  Filled 2021-02-26: qty 1

## 2021-02-26 NOTE — ED Notes (Signed)
Patient has been calm and cooperative this entire shift.  Mood is appropriate.  Affect is pleasant.  Interaction with staff has been appropriate demonstrated through patient laughing while engaging in stories about his children.  Patient reports cursing during day shift while speaking with RN but states, "I didn't curse at her though.  I was just cursing.  I just want to leave.  If I have to go to the other place that's fine.  I just want to get out of here."

## 2021-02-26 NOTE — BH Assessment (Signed)
[facility] Assessment Progress Note    Per Maxie Barb, NP, this involuntary pt continues to require psychiatric hospitalization at this time.  At the direction of Nelly Rout, MD, this writer has sought placement for pt at facilities outside of the [facility] system.  Pt has been referred to numerous facilities over the past few days but the following facilities have been contacted to seek placement today, with results as noted:   Beds available, information sent, decision pending: [facility]    At capacity: [facility]     Doylene Canning, [place] Behavioral Health Coordinator [phone]

## 2021-02-26 NOTE — ED Notes (Signed)
Pt alert this shift. Pt cooperative. Interactive with staff. No aggression or agitation. Denied SI/H and A/V hallucinations.
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
## 2021-02-26 NOTE — ED Notes (Signed)
Pt alert.  Cooperative.  Walking around room. Watching TV.  No s/s of distress. No agitation noted

## 2021-02-26 NOTE — ED Notes (Addendum)
Patient requesting something to help him sleep.  MD advised to give PRN Vistaril.

## 2021-02-27 DIAGNOSIS — F203 Undifferentiated schizophrenia: Secondary | ICD-10-CM

## 2021-02-27 MED ORDER — BISMUTH SUBSALICYLATE 262 MG/15ML PO SUSP
30.0000 mL | Freq: Three times a day (TID) | ORAL | Status: DC
  Administered 2021-02-27 – 2021-03-01 (×6): 30 mL via ORAL
  Filled 2021-02-27: qty 236

## 2021-02-27 MED ORDER — OLANZAPINE 10 MG PO TBDP
10.0000 mg | ORAL_TABLET | Freq: Every day | ORAL | Status: DC
  Administered 2021-02-27 – 2021-02-28 (×2): 10 mg via ORAL
  Filled 2021-02-27 (×2): qty 1

## 2021-02-27 MED ORDER — ONDANSETRON 4 MG PO TBDP
4.0000 mg | ORAL_TABLET | Freq: Three times a day (TID) | ORAL | Status: DC | PRN
  Administered 2021-02-27: 4 mg via ORAL
  Filled 2021-02-27: qty 1

## 2021-02-27 NOTE — ED Notes (Signed)
Patient complaining of abdominal pain.

## 2021-02-27 NOTE — Progress Notes (Signed)
Per Leroy Sea, patient meets criteria for inpatient treatment. There are no available or appropriate beds at [facility] today. CSW faxed referrals to the following facilities for review:  [facility] Donalda Ewings [facility] Desiree Hane  TTS will continue to seek bed placement.  Crissie Reese, MSW, LCSW-A, LCAS-A Phone: [phone] Disposition/TOC

## 2021-02-27 NOTE — ED Notes (Signed)
Pt alert this shift. Pt upset continues to be in ED. Pt denied SI/HI. Not A/V hallucinations noted. Anxious at times. Cooperative with staff.

## 2021-02-27 NOTE — ED Notes (Signed)
Patient calm and cooperative this shift.  Interaction with staff has been appropriate.  Patient denies SI/HI/AVH.  No distress noted.

## 2021-02-27 NOTE — ED Notes (Signed)
Patient currently standing in doorway interacting with staff appropriately.  Affect is bright demonstrated by patient smiling and laughing during conversation.  Mood is pleasant.

## 2021-02-27 NOTE — ED Provider Notes (Signed)
Emergency Medicine Observation Re-evaluation Note  Andres Mccall is a 25 y.o. male, seen on rounds today.  Pt initially presented to the ED for complaints of IVC Currently, the patient is resting comfortably.  Physical Exam  BP (!) 120/97 (BP Location: Right Arm)   Pulse 96   Temp (!) 97.4 F (36.3 C) (Oral)   Resp 16   SpO2 100%  Physical Exam General: resting, asleep   ED Course / MDM  EKG:EKG Interpretation  Date/Time:  Sunday February 21 2021 14:42:19 EDT Ventricular Rate:  62 PR Interval:  140 QRS Duration: 94 QT Interval:  394 QTC Calculation: 399 R Axis:   88 Text Interpretation: Normal sinus rhythm Normal ECG When compared with ECG of 12/31/2020, No significant change was found Confirmed by Dione Booze ([id number]) on 02/22/2021 2:06:33 AM  I have reviewed the labs performed to date as well as medications administered while in observation.  Recent changes in the last 24 hours include no acute events reported.  Plan  Current plan is for psychiatric placement. Andres Mccall is under involuntary commitment.      Wynetta Fines, MD 02/27/21 [phone]

## 2021-02-27 NOTE — Progress Notes (Signed)
[facility] advised that the patient is under review for placement.  Marguerette Sheller, MSW, LCSW-A, LCAS-A Phone: [phone] Disposition/TOC 

## 2021-02-27 NOTE — ED Notes (Signed)
Pt restless, pacing, elevated, upset about situation . Pt states he is hearing voices.

## 2021-02-28 NOTE — Progress Notes (Signed)
CSW contacted the [facility] in reference to initiate a care coordination referral. The referral was submitted.  Crissie Reese, MSW, LCSW-A, LCAS-A Phone: [phone] Disposition/TOC

## 2021-02-28 NOTE — ED Notes (Addendum)
Pt has been up wandering around the room. Pt has been stating NP Laurey Morale full name and wanting to know when she is coming to round on him. Pt also asked this RN her last name and RN explained to pt we are not allowed to give out that information. When NP Brook came in and pt saw her he asked for another provider although NP had not spoken to him.  Pt has been observed talking to himself and continues to ask when his IVC will be out and if they are going to renew it.

## 2021-02-28 NOTE — BH Assessment (Addendum)
Wrong chart

## 2021-02-28 NOTE — Progress Notes (Signed)
As of 02/27/21 patient is considered for inpatient psych. CSW sees no insurance listed under patient. Patient will need medicaid for ACT services. [facility] is currently not taking any new referrals. [facility] requires Medicaid.

## 2021-02-28 NOTE — Progress Notes (Signed)
Per Leroy Sea, patient meets criteria for inpatient treatment. There are no available or appropriate beds at [facility] today. CSW faxed referrals to the following facilities for review:   Service Provider Request Status Selected Services Address Phone Fax Patient Preferred  [facility]  Pending - Request Sent N/A [address] [place] [place] [address] [phone] --  [facility]  Pending - Request Sent N/A [address] [place] [address] [phone] --  [facility]  Pending - Request Sent N/A [address] [place] [address] [phone] --  [facility]  Pending - Request Sent N/A [address] [place] [address] [phone] --  [facility]  Pending - Request Sent N/A [address] [place], [place] [address] [phone] --  [facility]  Pending - Request Sent N/A [address] [place] [address] [phone] --  [facility]  Pending - Request Sent N/A [address], [place] [address] [phone] --  [facility]  Pending - Request Sent N/A [address] [place] [address] [phone] --  [facility]  Pending - Request Sent N/A [address] [place] [address] [phone] --  [facility]  Pending - Request Sent N/A [address] [place] [address] [phone] --  [facility]  Pending - Request Sent N/A [address] [place] [address] [phone] --  [facility]  Pending - Request Sent N/A [address] [place] [address] [phone] --  [facility]  Pending - Request Sent  N/A [address] [place] [address] [phone] --  [facility]  Pending - Request Sent N/A [address] [place] [address] [phone] --  [facility]  Pending - Request Sent N/A [address], [place] [address] [phone] --  [facility]  Pending - Request Sent N/A [address], [place] [address] [phone] --  [facility]  Pending - Request Sent N/A [address] [place] [address] [phone] --  [facility]  Pending - Request Sent N/A [address] [place] [address] [phone] --  [facility]  Pending - Request Sent N/A [phone] [place] [address] [phone] --  [facility]  Pending - Request Sent N/A [address] [place] [address] [phone] --  [facility]  Pending - Request Sent N/A [address] [place] [address] [phone] --  [facility]  Pending - Request Sent N/A [address] [place] [address] [phone] --  [facility]  Pending - Request Sent N/A [address] [place] [address] [phone] --  [facility]  Pending - Request Sent N/A [address] [place] [address] [phone] --  [facility]  Pending - Request Sent N/A [address] [place] [address] [phone] --  [facility]  Pending - Request Sent N/A [address], [place] [address] [phone] --  [facility]  Pending - Request Sent N/A [address] [place], [place] [address] [phone] --  [facility]  Pending -  Request Sent N/A  [address] [place] [address] [phone] --  [facility]  Pending - Request Sent N/A [address], [place] [address] [phone] --   TTS will continue to seek bed placement.  Crissie Reese, MSW, LCSW-A, LCAS-A Phone: [phone] Disposition/TOC

## 2021-02-28 NOTE — ED Provider Notes (Signed)
Emergency Medicine Observation Re-evaluation Note  Andres Mccall is a 25 y.o. male, seen on rounds today.  Pt initially presented to the ED for complaints of IVC Currently, the patient is resting/comfortable.  Physical Exam  BP 119/88 (BP Location: Right Arm)   Pulse 62   Temp (!) 97.4 F (36.3 C)   Resp 18   SpO2 99%  Physical Exam General: asleep/comfortable   ED Course / MDM  EKG:EKG Interpretation  Date/Time:  Sunday February 21 2021 14:42:19 EDT Ventricular Rate:  62 PR Interval:  140 QRS Duration: 94 QT Interval:  394 QTC Calculation: 399 R Axis:   88 Text Interpretation: Normal sinus rhythm Normal ECG When compared with ECG of 12/31/2020, No significant change was found Confirmed by Dione Booze ([id number]) on 02/22/2021 2:06:33 AM  I have reviewed the labs performed to date as well as medications administered while in observation.  Recent changes in the last 24 hours include no acute events reported by staff.  Plan  Current plan is for placement. Dionta Kissner is under involuntary commitment.      Wynetta Fines, MD 02/28/21 0730
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
## 2021-02-28 NOTE — ED Notes (Signed)
Pt has been attempting to pull hand rail off bathroom wall. Security aware.

## 2021-02-28 NOTE — ED Notes (Signed)
Patient restless throughout the night requesting pictures to color as he watched TV.  Interaction with staff has been appropriate.

## 2021-02-28 NOTE — BH Assessment (Addendum)
Patient was seen for re-asessment.  Patient is alert, oriented, cooperative, pleasant.  He denies SI/HI/Psychosis.  Patient states that he has been taking his medicine, he is seven days clean off drugs.  Patient states that he has a safety plan to stay with his mother in [place] when discharged.  Patient does request that he be discharged with a prescription for Zyprexa 10 mg.  TTS to have provider to see patient for final disposition.  TTS spoke to Maxie Barb, NP who states that patient's behavior has continued to be erratic and threatening while in the ED.  She states that she has spoken to patient's mother who is scared of him and is not willing to let him come to her home.  Therefore, continued inpatient is recommended.

## 2021-02-28 NOTE — Progress Notes (Signed)
[facility] advised that the patient is still under review for placement.   Crissie Reese, MSW, LCSW-A, LCAS-A Phone: [phone] Disposition/TOC

## 2021-03-01 ENCOUNTER — Encounter (HOSPITAL_COMMUNITY): Payer: No Payment, Other | Admitting: Psychiatry

## 2021-03-01 ENCOUNTER — Other Ambulatory Visit (HOSPITAL_COMMUNITY): Payer: Self-pay

## 2021-03-01 DIAGNOSIS — F203 Undifferentiated schizophrenia: Secondary | ICD-10-CM

## 2021-03-01 MED ORDER — ARIPIPRAZOLE ER 400 MG IM SRER
400.0000 mg | INTRAMUSCULAR | Status: DC
  Administered 2021-03-01: 400 mg via INTRAMUSCULAR
  Filled 2021-03-01: qty 2

## 2021-03-01 MED ORDER — TRAZODONE HCL 150 MG PO TABS
150.0000 mg | ORAL_TABLET | Freq: Every day | ORAL | 0 refills | Status: DC
  Filled 2021-03-01: qty 30, 30d supply, fill #0

## 2021-03-01 MED ORDER — OLANZAPINE 10 MG PO TBDP
10.0000 mg | ORAL_TABLET | Freq: Every day | ORAL | 0 refills | Status: DC
  Filled 2021-03-01 – 2021-03-10 (×2): qty 7, 7d supply, fill #0

## 2021-03-01 NOTE — ED Notes (Signed)
Pt DC d off unit to home per provider. Pt alert, calm, cooperative, no s/s of distress. DC information given to pt, pt acknowledged understanding. Belongings given to pt. Pt ambulatory off unit, escorted by RN. Pt using uber for transportation.

## 2021-03-01 NOTE — ED Notes (Signed)
Pt. Has been very calm and cooperative throughout the night. Pt hasn't shown any aggression or agitation towards staff. Been very clear in conversation with staff. Has been singing and rapping songs periodically. No observation of hallucinations tonight.
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
## 2021-03-02 ENCOUNTER — Other Ambulatory Visit (HOSPITAL_COMMUNITY): Payer: Self-pay

## 2021-03-10 ENCOUNTER — Other Ambulatory Visit (HOSPITAL_COMMUNITY): Payer: Self-pay

## 2021-03-11 ENCOUNTER — Ambulatory Visit (HOSPITAL_COMMUNITY): Payer: PRIVATE HEALTH INSURANCE

## 2021-04-01 ENCOUNTER — Encounter (HOSPITAL_COMMUNITY): Payer: PRIVATE HEALTH INSURANCE | Admitting: Psychiatry

## 2021-04-09 ENCOUNTER — Other Ambulatory Visit: Payer: Self-pay

## 2021-04-09 ENCOUNTER — Other Ambulatory Visit (HOSPITAL_COMMUNITY): Payer: Self-pay | Admitting: Physician Assistant

## 2021-04-09 ENCOUNTER — Ambulatory Visit (HOSPITAL_COMMUNITY): Payer: PRIVATE HEALTH INSURANCE | Admitting: *Deleted

## 2021-04-09 ENCOUNTER — Encounter (HOSPITAL_COMMUNITY): Payer: Self-pay

## 2021-04-09 ENCOUNTER — Ambulatory Visit (INDEPENDENT_AMBULATORY_CARE_PROVIDER_SITE_OTHER): Payer: No Payment, Other | Admitting: Clinical

## 2021-04-09 VITALS — BP 134/78 | HR 65 | Ht 72.0 in | Wt 170.0 lb

## 2021-04-09 DIAGNOSIS — F1994 Other psychoactive substance use, unspecified with psychoactive substance-induced mood disorder: Secondary | ICD-10-CM

## 2021-04-09 MED ORDER — ARIPIPRAZOLE ER 400 MG IM PRSY
400.0000 mg | PREFILLED_SYRINGE | Freq: Once | INTRAMUSCULAR | Status: AC
  Administered 2021-04-09: 400 mg via INTRAMUSCULAR

## 2021-04-09 NOTE — Progress Notes (Signed)
In as a walk in accompanied by his mom and is 26 yo son. He has not been seen or had his Abilify Injection since July. The last time I saw him he ad recently been discharged from the hospital and he was going to [facility] where his mom lives and hadnt anticipated him returning. He was last in the hospital in late July. He is currently homeless, he has broken up with his girlfriend who was living here and did in fact come from [place] today for this shot. He continues to demonstrate little insight. He is pleasant and joking and minimizes his illness and situation. He does endorse periods of visual hallucinations but denies having them now. He has a good personal appearance. He was seen first by [facility] as its been months since a provider here has seen him to assess him and the appropriateness of him getting his shot today. He got his Abilify M 400 mg injection in her R DELTOID without difficulty. Discussed with him no more walk in appts, he will have to keep a Tues or Thurs appt going forward. He verbalized his understanding.
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
## 2021-05-06 ENCOUNTER — Other Ambulatory Visit: Payer: Self-pay

## 2021-05-06 ENCOUNTER — Ambulatory Visit (INDEPENDENT_AMBULATORY_CARE_PROVIDER_SITE_OTHER): Payer: No Payment, Other | Admitting: Registered Nurse

## 2021-05-06 ENCOUNTER — Encounter (HOSPITAL_COMMUNITY): Payer: Self-pay | Admitting: Registered Nurse

## 2021-05-06 ENCOUNTER — Encounter (HOSPITAL_COMMUNITY): Payer: Self-pay

## 2021-05-06 ENCOUNTER — Ambulatory Visit (INDEPENDENT_AMBULATORY_CARE_PROVIDER_SITE_OTHER): Payer: No Payment, Other | Admitting: *Deleted

## 2021-05-06 VITALS — BP 133/77 | HR 88 | Ht 72.0 in | Wt 173.0 lb

## 2021-05-06 DIAGNOSIS — F209 Schizophrenia, unspecified: Secondary | ICD-10-CM

## 2021-05-06 DIAGNOSIS — F1994 Other psychoactive substance use, unspecified with psychoactive substance-induced mood disorder: Secondary | ICD-10-CM

## 2021-05-06 DIAGNOSIS — F1210 Cannabis abuse, uncomplicated: Secondary | ICD-10-CM

## 2021-05-06 DIAGNOSIS — F411 Generalized anxiety disorder: Secondary | ICD-10-CM | POA: Diagnosis not present

## 2021-05-06 MED ORDER — ARIPIPRAZOLE ER 400 MG IM PRSY
400.0000 mg | PREFILLED_SYRINGE | INTRAMUSCULAR | Status: AC
  Administered 2021-05-06 – 2022-02-09 (×4): 400 mg via INTRAMUSCULAR

## 2021-05-06 NOTE — Progress Notes (Signed)
APtient arrived ARIPiprazole ER (ABILIFY MAINTENA) 400 MG  Given today in Left-Arm. Tolerated Well. Patient pleasant as always . Noted still on the job search & his son will start daycare soon.

## 2021-05-06 NOTE — Progress Notes (Signed)
BH MD/PA/NP OP Progress Note  05/06/2021 3:15 PM Andres Mccall  MRN:  [id number]  Chief Complaint:  Chief Complaint   Abilify Maintena Injection    HPI: Andres Mccall, 25 y.o., male seen today for follow up and administration of long acting injectable (Abilify Maintena).  Patient has a psychiatric history of Schizophrenia, anxiety, depression, and marijuana use.  He is currently being managed on Abilify Maintena 400 mg monthly.  Patient reporting he has tolerated the medication well without any adverse reaction.    Today Andres Mccall is dressed appropriated for weather and well groomed.  He reports there has been minimal anxiety and when he feels stressed he goes for a walk to calm down.  He states there has been no depression and the Abilify Maintena continues to help with mood.  He denies any auditory/visual hallucinations, delusional thinking, and paranoia.  He reports that he feels that his mental health is stable.  He states he has been sleeping and eating without difficulty.  States he hasn't had to use the Vistaril or the Trazodone for anxiety and depression.  He denies suicidal/self-harm/homicidal ideation, psychosis, and paranoia.  GAD-7, PHQ-9, CSRRS, AIMS conducted by provider see scores below.   Andres Mccall states he is currently unemployed but looking for work.  States he is suppose to get a call from the [employer] next week related to application he filled out. States right now he spends most of his time with his children 5 yr old daughter and 1 yr old son.    Andres Mccall reports he continues to smoke marijuana on a daily basis.  States he has had some episodes of paranoia but no auditory or visual hallucination. Patient encouraged to avoid marijuana use and adverse reactions can mimic schizophrenia or exacerbate schizophrenia.  Understanding voiced.     Visit Diagnosis:    ICD-10-CM   1. Schizophrenia, unspecified type (HCC)  F20.9     2. Generalized anxiety disorder  F41.1     3.  Marijuana abuse, continuous  F12.10       Past Psychiatric History: General anxiety, Substance induced mood disorder, Marijuana abuse continuous, and Schizophrenia  Past Medical History:  Past Medical History:  Diagnosis Date   Bipolar affective (HCC)    Schizophrenia (HCC)    History reviewed. No pertinent surgical history.  Family Psychiatric History: Denies family psychiatric history  Family History: History reviewed. No pertinent family history.  Social History:  Social History   Socioeconomic History   Marital status: Single    Spouse name: Not on file   Number of children: Not on file   Years of education: Not on file   Highest education level: Not on file  Occupational History   Not on file  Tobacco Use   Smoking status: Unknown   Smokeless tobacco: Not on file  Substance and Sexual Activity   Alcohol use: Yes   Drug use: Yes    Types: Marijuana    Comment: Delta 8   Sexual activity: Not on file  Other Topics Concern   Not on file  Social History Narrative   Not on file   Social Determinants of Health   Financial Resource Strain: Not on file  Food Insecurity: Not on file  Transportation Needs: Not on file  Physical Activity: Not on file  Stress: Not on file  Social Connections: Not on file    Allergies: No Known Allergies  Metabolic Disorder Labs: Lab Results  Component Value Date   HGBA1C  5.3 12/29/2020   MPG 105 12/29/2020   No results found for: PROLACTIN Lab Results  Component Value Date   CHOL 138 12/29/2020   TRIG 47 12/29/2020   HDL 52 12/29/2020   CHOLHDL 2.7 12/29/2020   VLDL 9 12/29/2020   LDLCALC 77 12/29/2020   Lab Results  Component Value Date   TSH 2.574 12/29/2020    Therapeutic Level Labs: No results found for: LITHIUM No results found for: VALPROATE No components found for:  CBMZ  Current Medications: Current Outpatient Medications  Medication Sig Dispense Refill   hydrOXYzine (ATARAX/VISTARIL) 50 MG tablet  Take 0.5 tablets (25 mg total) by mouth 3 (three) times daily as needed for anxiety. 90 tablet 0   traZODone (DESYREL) 150 MG tablet Take 1 tablet (150 mg total) by mouth at bedtime. 30 tablet 0   Current Facility-Administered Medications  Medication Dose Route Frequency Provider Last Rate Last Admin   ARIPiprazole ER (ABILIFY MAINTENA) 400 MG prefilled syringe 400 mg  400 mg Intramuscular Q28 days Amazing Cowman B, NP   400 mg at 05/06/21 1330     Musculoskeletal: Strength & Muscle Tone: within normal limits Gait & Station: normal Patient leans: N/A  Psychiatric Specialty Exam: Review of Systems  Constitutional: Negative.   HENT: Negative.    Eyes: Negative.   Respiratory: Negative.    Cardiovascular: Negative.   Gastrointestinal: Negative.   Genitourinary: Negative.   Musculoskeletal: Negative.   Skin: Negative.   Neurological: Negative.   Hematological: Negative.   Psychiatric/Behavioral:  Negative for agitation (Denies), behavioral problems, decreased concentration, hallucinations (Denies), self-injury, sleep disturbance (Denies) and suicidal ideas (Denies). Dysphoric mood: Stable.Nervous/anxious: Stable.    There were no vitals taken for this visit.There is no height or weight on file to calculate BMI.  General Appearance: Casual and Well Groomed  Eye Contact:  Good  Speech:  Clear and Coherent and Normal Rate  Volume:  Normal  Mood:  Euthymic  Affect:  Appropriate and Congruent  Thought Process:  Coherent and Goal Directed  Orientation:  Full (Time, Place, and Person)  Thought Content: WDL and Logical   Suicidal Thoughts:  No  Homicidal Thoughts:  No  Memory:  Immediate;   Good Recent;   Good Remote;   Good  Judgement:  Intact  Insight:  Good and Present  Psychomotor Activity:  Normal  Concentration:  Concentration: Good and Attention Span: Good  Recall:  Good  Fund of Knowledge: Good  Language: Good  Akathisia:  No  Handed:  Right  AIMS (if indicated): done   Assets:  Communication Skills Desire for Improvement Financial Resources/Insurance Housing Leisure Time Physical Health Social Support  ADL's:  Intact  Cognition: WNL  Sleep:  Good   Screenings: AIMS    Flowsheet Row Office Visit from 05/06/2021 in [facility]  AIMS Total Score 0      GAD-7    Flowsheet Row Office Visit from 05/06/2021 in [facility] Clinical Support from 12/30/2020 in [facility]  Total GAD-7 Score 0 18      PHQ2-9    Flowsheet Row Office Visit from 05/06/2021 in [facility] Clinical Support from 12/30/2020 in [facility]  PHQ-2 Total Score 0 4  PHQ-9 Total Score -- 13      Flowsheet Row Office Visit from 05/06/2021 in [facility] Counselor from 04/09/2021 in [facility] ED from 02/21/2021 in  [facility]  C-SSRS RISK CATEGORY No Risk No Risk No Risk       1. Schizophrenia, unspecified type (HCC)  Abilify Maintena 400 mg IM Q month  Trazodone 150 mg Q hs prn sleep  2. General anxiety disorder  Vistaril 10 mg Tid prn anxiety  3. Marijuana abuse, continuous   Assessment and Plan: Andres Mccall reports depression and anxiety are stable.  Reporting he has not had to use Vistaril.  Patient also reporting that he has been sleeping well and has not needed the Trazodone.  He endorses the continuous use of marijuana but understands that it can exacerbate his schizophrenia.  Encouraged to stop or at least decrease usage.    Follow up in one month  Andres Corniel, NP 05/06/2021, 3:15 PM

## 2021-05-25 ENCOUNTER — Ambulatory Visit (HOSPITAL_COMMUNITY): Payer: No Payment, Other | Admitting: Clinical

## 2021-06-03 ENCOUNTER — Ambulatory Visit (HOSPITAL_COMMUNITY): Payer: PRIVATE HEALTH INSURANCE

## 2021-06-08 ENCOUNTER — Telehealth (HOSPITAL_COMMUNITY): Payer: Self-pay | Admitting: *Deleted

## 2021-06-08 NOTE — Telephone Encounter (Signed)
Mom called back to give me the fax number to the [facility] for me to fax to them verification of his injection and his oral meds. Faxed [phone].
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
## 2021-09-28 ENCOUNTER — Telehealth (HOSPITAL_COMMUNITY): Payer: Self-pay | Admitting: *Deleted

## 2021-09-28 NOTE — Telephone Encounter (Signed)
Opened a second time in error. 

## 2021-09-30 ENCOUNTER — Ambulatory Visit (HOSPITAL_COMMUNITY): Payer: No Payment, Other

## 2021-09-30 ENCOUNTER — Ambulatory Visit (HOSPITAL_COMMUNITY): Payer: Self-pay

## 2021-10-05 ENCOUNTER — Ambulatory Visit (INDEPENDENT_AMBULATORY_CARE_PROVIDER_SITE_OTHER): Payer: No Payment, Other | Admitting: Family

## 2021-10-05 ENCOUNTER — Encounter (HOSPITAL_COMMUNITY): Payer: Self-pay

## 2021-10-05 ENCOUNTER — Other Ambulatory Visit: Payer: Self-pay

## 2021-10-05 ENCOUNTER — Ambulatory Visit (HOSPITAL_COMMUNITY): Payer: No Payment, Other | Admitting: *Deleted

## 2021-10-05 VITALS — BP 124/80 | HR 105 | Ht 72.0 in | Wt 176.0 lb

## 2021-10-05 DIAGNOSIS — F411 Generalized anxiety disorder: Secondary | ICD-10-CM | POA: Diagnosis not present

## 2021-10-05 DIAGNOSIS — F209 Schizophrenia, unspecified: Secondary | ICD-10-CM

## 2021-10-05 DIAGNOSIS — F1210 Cannabis abuse, uncomplicated: Secondary | ICD-10-CM

## 2021-10-05 MED ORDER — ARIPIPRAZOLE ER 400 MG IM PRSY
400.0000 mg | PREFILLED_SYRINGE | INTRAMUSCULAR | Status: AC
Start: 2021-10-05 — End: 2021-12-28
  Administered 2021-11-02 – 2021-11-30 (×2): 400 mg via INTRAMUSCULAR

## 2021-10-05 NOTE — Progress Notes (Signed)
BH MD/PA/NP OP Progress Note  10/05/2021 1:21 PM Andres Mccall  MRN:  [id number]  Chief Complaint: No chief complaint on file.  HPI:   Andres Mccall is a 26 year old male seen today for follow-up psychiatric evaluation. Psychiatric history includes schizophrenia, bipolar disorder, substance induced mood disorder and marijuana use.   He has been managed historically on  Abilify Maintena.  Andres Mccall was recently released from jail, after a two month jail stay, approximately three weeks ago. While in jail he was compliant with Abilify 20mg  PO BID. He stopped his Abilify PO 20mg  twice daily approximately two weeks ago. While in jail Andres Mccall experienced "racing thoughts." He also endorses increased paranoia over the past two weeks. He would like to be restarted on Abilify Maintena 400 mg monthly today. Additionally he would like to be restarted on hydroxyzine 10mg  TID as needed for anxiety. He has used hydroxyzine 25mg  TID in the past, felt the dose should be decreased.   Andres Mccall endorses daily marijuana use, typically using cannabis four, or more, times daily. Last marijuana use earlier this date. He uses alcohol rarely, approximately one time per week. Last alcohol use approx one week ago. He denies substance use aside from cannabis and alcohol.   Recent stressors include seeking employment. He would like to be employed in the [employer].   Andres Mccall is not followed by outpatient therapy. Shares that he was seen, once, by outpatient therapy and did not feel that this was therapeutic He states "I am not a therapy type of guy."   Patient is assessed face-to-face by nurse practitioner, chart reviewed. Today patient is seated, no acute distress. He is appropriately groomed. He is alert and oriented, pleasant and cooperative. He has clear and coherent speech average volume.  Behavior calm and appropriate, with good eye contact.  Mood today appears euthymic, congruent affect.   Today provider conducted  PHQ-2, he scored a 0, at last visit, on 05/06/2021,  he also scored a 0.  He denies suicidal and homicidal ideations currently.  He contracts verbally for safety with this Probation officer.      He denies both auditory and visual hallucinations.  There is no indication that he is responding to internal stimuli, no evidence of delusional thought content.  Patient is able to converse coherently with goal-directed thoughts and no distractibility or preoccupation. Objectively there is no evidence of psychosis/mania or delusional thinking. Patient is insightful regarding treatment and diagnosis.   Patient is tolerating medications with no adverse effects/reactions per his report.  Andres Mccall resides in [place] with his children's mother and two children, ages 22 and 62 years old part of the time. He resides part-time in [place], with his mother, as well. Patient reports average sleep and appetite.  He denies physical pain currently.    Patient provided support and encouragement.  Patient to receive LAI Abilify Maintena 400mg  monthly. Plan to follow-up in one month for Abilify Maintena monthly injection.  Visit Diagnosis: No diagnosis found.  Past Psychiatric History: schizophrenia, bipolar affective disorder, substance induced mood disorder, generalized anxiety disorder, cannabis abuse  Past Medical History:  Past Medical History:  Diagnosis Date   Bipolar affective ([place])    Schizophrenia ([place])    No past surgical history on file.  Family Psychiatric History: Paternal grandfather- alcohol use disorder  Family History: No family history on file.  Social History:  Social History   Socioeconomic History   Marital status: Single    Spouse name: Not on file  Number of children: Not on file   Years of education: Not on file   Highest education level: Not on file  Occupational History   Not on file  Tobacco Use   Smoking status: Unknown   Smokeless tobacco: Not on file  Substance and Sexual  Activity   Alcohol use: Yes   Drug use: Yes    Types: Marijuana    Comment: Delta 8   Sexual activity: Not on file  Other Topics Concern   Not on file  Social History Narrative   Not on file   Social Determinants of Health   Financial Resource Strain: Not on file  Food Insecurity: Not on file  Transportation Needs: Not on file  Physical Activity: Not on file  Stress: Not on file  Social Connections: Not on file    Allergies: No Known Allergies  Metabolic Disorder Labs: Lab Results  Component Value Date   HGBA1C 5.3 12/29/2020   MPG 105 12/29/2020   No results found for: PROLACTIN Lab Results  Component Value Date   CHOL 138 12/29/2020   TRIG 47 12/29/2020   HDL 52 12/29/2020   CHOLHDL 2.7 12/29/2020   VLDL 9 12/29/2020   [place] 77 12/29/2020   Lab Results  Component Value Date   TSH 2.574 12/29/2020    Therapeutic Level Labs: No results found for: LITHIUM No results found for: VALPROATE No components found for:  CBMZ  Current Medications: Current Outpatient Medications  Medication Sig Dispense Refill   hydrOXYzine (ATARAX/VISTARIL) 50 MG tablet Take 0.5 tablets (25 mg total) by mouth 3 (three) times daily as needed for anxiety. 90 tablet 2   traZODone (DESYREL) 150 MG tablet Take 1 tablet (150 mg total) by mouth at bedtime. 30 tablet 0   Current Facility-Administered Medications  Medication Dose Route Frequency Provider Last Rate Last Admin   ARIPiprazole ER (ABILIFY MAINTENA) 400 MG prefilled syringe 400 mg  400 mg Intramuscular Q28 days Rankin, Shuvon B, NP   400 mg at 10/05/21 1236     Musculoskeletal: Strength & Muscle Tone: within normal limits Gait & Station: normal Patient leans: N/A  Psychiatric Specialty Exam: Review of Systems  Constitutional: Negative.   HENT: Negative.    Eyes: Negative.   Respiratory: Negative.    Cardiovascular: Negative.   Gastrointestinal: Negative.   Genitourinary: Negative.   Musculoskeletal: Negative.    Skin: Negative.   Neurological: Negative.   Hematological: Negative.   Psychiatric/Behavioral: Negative.     There were no vitals taken for this visit.There is no height or weight on file to calculate BMI.  General Appearance: Casual and Fairly Groomed  Eye Contact:  Good  Speech:  Clear and Coherent and Normal Rate  Volume:  Normal  Mood:  Euthymic  Affect:  Appropriate and Congruent  Thought Process:  Coherent, Goal Directed, and Linear  Orientation:  Full (Time, Place, and Person)  Thought Content: WDL and Logical   Suicidal Thoughts:  No  Homicidal Thoughts:  No  Memory:  Immediate;   Good Recent;   Good  Judgement:  Fair  Insight:  Fair  Psychomotor Activity:  Normal  Concentration:  Concentration: Good and Attention Span: Good  Recall:  Good  Fund of Knowledge: Good  Language: Good  Akathisia:  No  Handed:  Right  AIMS (if indicated): done  Assets:  Communication Skills Desire for Improvement Financial Resources/Insurance Housing Intimacy Leisure Time Physical Health Resilience Social Support  ADL's:  Intact  Cognition: WNL  Sleep:  Good   Screenings: [place] Office Visit from 05/06/2021 in [place] Total Score 0      GAD-7    [place] Office Visit from 05/06/2021 in [place] from 12/30/2020 in [facility]  Total GAD-7 Score 0 18      PHQ2-9    [place] Office Visit from 10/05/2021 in [facility] Office Visit from 05/06/2021 in [place] from 12/30/2020 in [facility]  PHQ-2 Total Score 0 0 4  PHQ-9 Total Score -- -- [place] Office Visit from 10/05/2021 in [facility] Office Visit from 05/06/2021 in [facility] Counselor from 04/09/2021 in  [place] No Risk No Risk No Risk        Assessment and Plan: Patient reviewed with Dr Hampton Abbot. Long-acting Abilify Maintena 400mg  IM  re-initiated today. Patient to follow up for long-acting Abilify Maintena 400mg  IM monthly in approximately one month  Collaboration of Care: Collaboration of Care:   Patient/Guardian was advised Release of Information must be obtained prior to any record release in order to collaborate their care with an outside provider. Patient/Guardian was advised if they have not already done so to contact the registration department to sign all necessary forms in order for [place] to release information regarding their care.   Consent: Patient/Guardian gives verbal consent for treatment and assignment of benefits for services provided during this visit. Patient/Guardian expressed understanding and agreed to proceed.    Lucky Rathke, FNP 10/05/2021, 1:21 PM

## 2021-10-05 NOTE — Progress Notes (Signed)
In as scheduled to see the NP and to get his injection after being discharged from prison system. He states he is doing well, has been with his kids and their mom for the past three weeks but otherwise he lives in [place] with his mom. See Inetta Fermo NPs note for more information. He got his Abilify M 400 mg in his R DELTOID without difficulty. He is to return in one month for his next injection.

## 2021-10-06 ENCOUNTER — Encounter (HOSPITAL_COMMUNITY): Payer: Self-pay | Admitting: Family

## 2021-11-02 ENCOUNTER — Other Ambulatory Visit: Payer: Self-pay

## 2021-11-02 ENCOUNTER — Encounter (HOSPITAL_COMMUNITY): Payer: Self-pay

## 2021-11-02 ENCOUNTER — Ambulatory Visit (INDEPENDENT_AMBULATORY_CARE_PROVIDER_SITE_OTHER): Payer: No Payment, Other | Admitting: *Deleted

## 2021-11-02 VITALS — BP 134/77 | HR 80 | Ht 72.0 in | Wt 178.0 lb

## 2021-11-02 DIAGNOSIS — F209 Schizophrenia, unspecified: Secondary | ICD-10-CM

## 2021-11-02 NOTE — Progress Notes (Signed)
In as scheduled for his monthly injection of Abilify M 400 mg, given today in his R DELTOID without issue. 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 He is to return in one month for his next injection. He is stable and no complaints voiced.  ?

## 2021-11-30 ENCOUNTER — Ambulatory Visit (INDEPENDENT_AMBULATORY_CARE_PROVIDER_SITE_OTHER): Payer: No Payment, Other | Admitting: *Deleted

## 2021-11-30 ENCOUNTER — Encounter (HOSPITAL_COMMUNITY): Payer: Self-pay

## 2021-11-30 VITALS — BP 122/82 | HR 90 | Ht 72.0 in | Wt 178.0 lb

## 2021-11-30 DIAGNOSIS — F209 Schizophrenia, unspecified: Secondary | ICD-10-CM

## 2021-11-30 MED ORDER — ABILIFY MAINTENA 400 MG IM PRSY: 400.0000 mg | PREFILLED_SYRINGE | INTRAMUSCULAR | 3 refills | Status: DC

## 2021-11-30 NOTE — Addendum Note (Signed)
Addended by: Lenard Lance on: 11/30/2021 12:24 PM ? ? Modules accepted: Orders, Level of Service ? ?

## 2021-11-30 NOTE — Progress Notes (Signed)
In accompanied by his 27 yo son. He is on time for his monthly injection of Abilify M 400 mg given today in his L DELTOID. He states he is doing well, he and his girlfriend took him out of his daycare due to concerns with his care there so patient is watching him today. He denies any concerns or psychotic sx. He is to return in one month for his next injection.  ?

## 2021-12-30 ENCOUNTER — Encounter (HOSPITAL_COMMUNITY): Payer: Self-pay

## 2021-12-30 ENCOUNTER — Ambulatory Visit (INDEPENDENT_AMBULATORY_CARE_PROVIDER_SITE_OTHER): Payer: No Payment, Other | Admitting: *Deleted

## 2021-12-30 ENCOUNTER — Ambulatory Visit (INDEPENDENT_AMBULATORY_CARE_PROVIDER_SITE_OTHER): Payer: No Payment, Other | Admitting: Registered Nurse

## 2021-12-30 ENCOUNTER — Encounter (HOSPITAL_COMMUNITY): Payer: Self-pay | Admitting: Registered Nurse

## 2021-12-30 VITALS — BP 133/87 | HR 105 | Ht 72.0 in | Wt 174.0 lb

## 2021-12-30 DIAGNOSIS — F411 Generalized anxiety disorder: Secondary | ICD-10-CM | POA: Diagnosis not present

## 2021-12-30 DIAGNOSIS — F209 Schizophrenia, unspecified: Secondary | ICD-10-CM | POA: Diagnosis not present

## 2021-12-30 DIAGNOSIS — F1994 Other psychoactive substance use, unspecified with psychoactive substance-induced mood disorder: Secondary | ICD-10-CM | POA: Diagnosis not present

## 2021-12-30 MED ORDER — ABILIFY MAINTENA 400 MG IM PRSY: 400.0000 mg | PREFILLED_SYRINGE | INTRAMUSCULAR | 3 refills | Status: DC

## 2021-12-30 MED ORDER — TRAZODONE HCL 50 MG PO TABS: 50.0000 mg | ORAL_TABLET | Freq: Every evening | ORAL | 2 refills | Status: DC | PRN

## 2021-12-30 NOTE — Progress Notes (Signed)
BH MD/PA/NP OP Progress Note  12/30/2021 11:23 AM Andres Mccall  MRN:  [id number]  Chief Complaint:  Chief Complaint  Patient presents with   follow up psychiatric evaluation and administration of long   HPI:  Andres Mccall is a [age] year old male seen in office today for follow-up psychiatric evaluation and administration of long acting injectable (Abilify Maintena).  His psychiatric history includes schizophrenia, bipolar disorder, substance induced mood disorder and marijuana use.   His mental health is managed with Abilify Maintena, Vistaril, and Trazodone.  He reports that he has had difficulty sleeping but hasn't been taking the Trazodone related to it making him feel worse the next day.  States he feel it is too strong.  Discussed lowing the dosage to 50 mg and will increase as needed.  States that he is not long taking the Vistaril.  Informed would discontinue the Vistaril.   He reports thing have been going well and that Abilify [name] is managing his mental health with no adverse reactions.  He states he has even gotten complements from family and friend "Saying that I haven't been tripping lately."  He reports that his mood has been pretty stable.  He denies depression, anxiety, suicidal/self-harm/homicidal ideation, psychosis, and paranoia.  Screening conducted by provider this visit AIMS, PHQ 2 & 9 with C-SSRS, and GAD 7 see scores below.  He reports that his appetite is fine; eating without difficulty.  He reports that he continues to smoke marijuana daily "but I ain't smoking as much as I use to.  I've cut down a whole lot."  Reporting he may smoke once a day now.  He verbalizes understanding that the use of marijuana can worsen his mental health.  He reports that he is currently employed and is liking his job and that it has good pay.    Visit Diagnosis:    ICD-10-CM   1. Schizophrenia, unspecified type (HCC)  F20.9 Comprehensive Metabolic Panel (CMET)    CBC with Differential    TSH     Prolactin    HgB A1c    Magnesium    Drug Screen, Urine    Lipid Profile    ARIPiprazole ER (ABILIFY MAINTENA) 400 MG PRSY prefilled syringe    traZODone (DESYREL) 50 MG tablet    2. Generalized anxiety disorder  F41.1     3. Substance induced mood disorder (HCC)  F19.94       Past Psychiatric History: schizophrenia, bipolar affective disorder, substance induced mood disorder, generalized anxiety disorder, cannabis abuse  Past Medical History:  Past Medical History:  Diagnosis Date   Bipolar affective (HCC)    Schizophrenia (HCC)    History reviewed. No pertinent surgical history.  Family Psychiatric History: Paternal grandfather- alcohol use disorder  Family History: History reviewed. No pertinent family history.  Social History:  Social History   Socioeconomic History   Marital status: Single    Spouse name: Not on file   Number of children: Not on file   Years of education: Not on file   Highest education level: Not on file  Occupational History   Not on file  Tobacco Use   Smoking status: Unknown   Smokeless tobacco: Not on file  Substance and Sexual Activity   Alcohol use: Yes   Drug use: Yes    Types: Marijuana    Comment: Delta 8   Sexual activity: Not on file  Other Topics Concern   Not on file  Social History Narrative   Not on  file   Social Determinants of Health   Financial Resource Strain: Not on file  Food Insecurity: Not on file  Transportation Needs: Not on file  Physical Activity: Not on file  Stress: Not on file  Social Connections: Not on file    Allergies: No Known Allergies  Metabolic Disorder Labs: Lab Results  Component Value Date   HGBA1C 5.3 12/29/2020   MPG 105 12/29/2020   No results found for: PROLACTIN Lab Results  Component Value Date   CHOL 138 12/29/2020   TRIG 47 12/29/2020   HDL 52 12/29/2020   CHOLHDL 2.7 12/29/2020   VLDL 9 12/29/2020   LDLCALC 77 12/29/2020   Lab Results  Component Value Date   TSH  2.574 12/29/2020    Therapeutic Level Labs: No results found for: LITHIUM No results found for: VALPROATE No components found for:  CBMZ  Current Medications: Current Outpatient Medications  Medication Sig Dispense Refill   traZODone (DESYREL) 50 MG tablet Take 1 tablet (50 mg total) by mouth at bedtime as needed for sleep. 30 tablet 2   ARIPiprazole ER (ABILIFY MAINTENA) 400 MG PRSY prefilled syringe Inject 400 mg into the muscle every 28 (twenty-eight) days. 1 each 3   hydrOXYzine (ATARAX/VISTARIL) 50 MG tablet Take 0.5 tablets (25 mg total) by mouth 3 (three) times daily as needed for anxiety. 90 tablet 2   Current Facility-Administered Medications  Medication Dose Route Frequency Provider Last Rate Last Admin   ARIPiprazole ER (ABILIFY MAINTENA) 400 MG prefilled syringe 400 mg  400 mg Intramuscular Q28 days [place] B, NP   400 mg at 12/30/21 1108     Musculoskeletal: Strength & Muscle Tone: within normal limits Gait & Station: normal Patient leans: N/A  Psychiatric Specialty Exam: Review of Systems  Constitutional: Negative.   HENT: Negative.    Eyes: Negative.   Respiratory: Negative.    Cardiovascular: Negative.   Gastrointestinal: Negative.   Genitourinary: Negative.   Musculoskeletal: Negative.   Skin: Negative.   Neurological: Negative.   Hematological: Negative.   Psychiatric/Behavioral:  Negative for agitation, decreased concentration, hallucinations, self-injury and suicidal ideas. The patient is not nervous/anxious.    There were no vitals taken for this visit.There is no height or weight on file to calculate BMI.  General Appearance: Casual  Eye Contact:  Good  Speech:  Clear and Coherent and Normal Rate  Volume:  Normal  Mood:  Euthymic  Affect:  Appropriate and Congruent  Thought Process:  Coherent, Goal Directed, and Descriptions of Associations: Intact  Orientation:  Full (Time, Place, and Person)  Thought Content: WDL and Logical   Suicidal  Thoughts:  No  Homicidal Thoughts:  No  Memory:  Immediate;   Good Recent;   Good Remote;   Good  Judgement:  Intact  Insight:  Present  Psychomotor Activity:  Normal  Concentration:  Concentration: Good and Attention Span: Good  Recall:  Good  Fund of Knowledge: Good  Language: Good  Akathisia:  No  Handed:  Right  AIMS (if indicated): done  Assets:  Communication Skills Desire for Improvement Housing Intimacy Resilience Social Support Transportation  ADL's:  Intact  Cognition: WNL  Sleep:  Fair   Screenings: AIMS    Flowsheet Row Office Visit from 05/06/2021 in [facility]  AIMS Total Score 0      GAD-7    Flowsheet Row Office Visit from 12/30/2021 in [facility] Office Visit from 05/06/2021 in [facility] Clinical Support from 12/30/2020 in [facility]  Total GAD-7 Score 0 0 18      PHQ2-9    Flowsheet Row Office Visit from 12/30/2021 in [facility] Office Visit from 10/05/2021 in [facility] Office Visit from 05/06/2021 in [facility] Clinical Support from 12/30/2020 in [facility]  PHQ-2 Total Score 0 0 0 4  PHQ-9 Total Score -- -- -- 13      Flowsheet Row Office Visit from 12/30/2021 in [facility] Office Visit from 10/05/2021 in [facility] Office Visit from 05/06/2021 in [facility]  C-SSRS RISK CATEGORY No Risk No Risk No Risk        Assessment and Plan:   Rashawd Laskaris appears to be doing well.  He reports that medications are effective and managing his mental health.  During visit he is dressed appropriate for weather.  He is sitting upright in chair with no noted distress.  He is alert/oriented x 4, calm/cooperative and mood is congruent with  affect.  He spoke in a clear tone at moderate volume, and normal pace, with good eye contact.  His thought process is coherent and relevant; and there is no indication that he is currently responding to internal/external stimuli or experiencing delusional thought content.  He denies depression, anxiety, suicidal/self-harm/homicidal ideation, psychosis, paranoia, and fluctuations in mood.  Vistaril discontinue related to no longer needing and Trazodone decreased to 50 mg Q hs prn related to his complaint that dosage was to strong and made him feel worse.  Will increase as needed.     Collaboration of Care: Collaboration of Care: Medication Management AEB Administration of long acting injectable and adjustment of medications along with refills and Other Labs ordered Meds ordered this encounter  Medications   ARIPiprazole ER (ABILIFY MAINTENA) 400 MG PRSY prefilled syringe    Sig: Inject 400 mg into the muscle every 28 (twenty-eight) days.    Dispense:  1 each    Refill:  3    Order Specific Question:   Supervising Provider    Answer:   Nelly Rout [[id number]]   traZODone (DESYREL) 50 MG tablet    Sig: Take 1 tablet (50 mg total) by mouth at bedtime as needed for sleep.    Dispense:  30 tablet    Refill:  2    Order Specific Question:   Supervising Provider    Answer:   Nelly Rout [[id number]]  Lab Orders         Comprehensive Metabolic Panel (CMET)         CBC with Differential         TSH         Prolactin         HgB A1c         Magnesium         Drug Screen, Urine         Lipid Profile      Patient/Guardian was advised Release of Information must be obtained prior to any record release in order to collaborate their care with an outside provider. Patient/Guardian was advised if they have not already done so to contact the registration department to sign all necessary forms in order for [place] to release information regarding their care.   Consent: Patient/Guardian gives verbal consent for  treatment and assignment of benefits for services provided during  this visit. Patient/Guardian expressed understanding and agreed to proceed.   Follow up in one month  Gwendolyne Welford, NP 12/30/2021, 11:23 AM

## 2021-12-30 NOTE — Progress Notes (Signed)
In as scheduled for his monthly injection of Abilify M 400 mg, given today in his R DELTOID for convenience, he has his 27 y o son on his L side in the chair. He is doing well. No complaints other than poor sleep. He had an assessment with NP Shuvon for his 27 months check in. He states he plans to start working again at [employer] in the next week. Needing to find a new daycare for his son first. He is to return in one month for his next shot.

## 2022-01-27 ENCOUNTER — Ambulatory Visit (HOSPITAL_COMMUNITY): Payer: No Payment, Other

## 2022-02-09 ENCOUNTER — Encounter (HOSPITAL_COMMUNITY): Payer: Self-pay

## 2022-02-09 ENCOUNTER — Ambulatory Visit (INDEPENDENT_AMBULATORY_CARE_PROVIDER_SITE_OTHER): Payer: No Payment, Other | Admitting: *Deleted

## 2022-02-09 VITALS — BP 111/69 | HR 92 | Ht 72.0 in | Wt 164.0 lb

## 2022-02-09 DIAGNOSIS — F209 Schizophrenia, unspecified: Secondary | ICD-10-CM

## 2022-02-09 NOTE — Progress Notes (Signed)
In a week late for his shot. He thought he had to get his lab work before coming to get his shot, did not know where to go and lost the orders we gave him. Re printed orders for him, gave instructions to his mom on where to go and to fast. Today he got his Abilify M 400 mg injection in his L DELTOID without issue. He has his son with him today and for the summer states he also has his daughter. He seems to be doing well and interacts well with his son. He is to return in one month.

## 2022-03-03 ENCOUNTER — Other Ambulatory Visit (HOSPITAL_COMMUNITY): Payer: Self-pay | Admitting: Psychiatry

## 2022-03-03 DIAGNOSIS — F209 Schizophrenia, unspecified: Secondary | ICD-10-CM

## 2022-03-03 NOTE — Telephone Encounter (Signed)
Received request for refill of Abilify LAI.  This was sent.   Sent: -Abilify Maintena 400 mg monthly.

## 2022-03-10 ENCOUNTER — Ambulatory Visit (HOSPITAL_COMMUNITY): Payer: No Payment, Other

## 2022-03-22 ENCOUNTER — Ambulatory Visit (HOSPITAL_COMMUNITY): Payer: No Payment, Other

## 2022-03-22 ENCOUNTER — Ambulatory Visit (INDEPENDENT_AMBULATORY_CARE_PROVIDER_SITE_OTHER): Payer: No Payment, Other | Admitting: Registered Nurse

## 2022-03-22 ENCOUNTER — Encounter (HOSPITAL_COMMUNITY): Payer: Self-pay

## 2022-03-22 ENCOUNTER — Encounter (HOSPITAL_COMMUNITY): Payer: Self-pay | Admitting: Registered Nurse

## 2022-03-22 VITALS — BP 121/82 | HR 72 | Ht 72.0 in | Wt 161.0 lb

## 2022-03-22 DIAGNOSIS — F1994 Other psychoactive substance use, unspecified with psychoactive substance-induced mood disorder: Secondary | ICD-10-CM

## 2022-03-22 DIAGNOSIS — F209 Schizophrenia, unspecified: Secondary | ICD-10-CM | POA: Diagnosis not present

## 2022-03-22 DIAGNOSIS — F1210 Cannabis abuse, uncomplicated: Secondary | ICD-10-CM

## 2022-03-22 DIAGNOSIS — F411 Generalized anxiety disorder: Secondary | ICD-10-CM

## 2022-03-22 MED ORDER — HYDROXYZINE HCL 50 MG PO TABS: 25.0000 mg | ORAL_TABLET | Freq: Three times a day (TID) | ORAL | 2 refills | Status: DC | PRN

## 2022-03-22 MED ORDER — TRAZODONE HCL 50 MG PO TABS: 50.0000 mg | ORAL_TABLET | Freq: Every evening | ORAL | 2 refills | Status: DC | PRN

## 2022-03-22 MED ORDER — ARIPIPRAZOLE ER 400 MG IM PRSY
400.0000 mg | PREFILLED_SYRINGE | Freq: Once | INTRAMUSCULAR | Status: AC
  Administered 2022-03-22: 400 mg via INTRAMUSCULAR

## 2022-03-22 MED ORDER — ABILIFY MAINTENA 400 MG IM PRSY: PREFILLED_SYRINGE | INTRAMUSCULAR | 2 refills | Status: DC

## 2022-03-22 NOTE — Progress Notes (Signed)
Patient presents to the office for Abilify Maintena 400 Mg injection in right Deltoid. Patient seems to doing well and will return in 28 days.

## 2022-03-22 NOTE — Progress Notes (Signed)
BH MD/PA/NP OP Progress Note  03/22/2022 11:37 AM Jocelyn Lowery  MRN:  [id number]  Chief Complaint:  Chief Complaint  Patient presents with   Follow up psychiatric evaluation and administration of long   follow up psychiatric evaluation and administration of long   HPI: Jefferson Fullam is a 26 year old male seen face to face in office today for follow-up psychiatric evaluation and administration of long acting injectable (Abilify Maintena).  His psychiatric history includes schizophrenia, bipolar disorder, substance induced mood disorder and marijuana use.  His mental health is managed with Abilify Maintena, Vistaril, and Trazodone.  He reports that medications are managing his mental health without adverse reactions.  He denies depression, anxiety, suicidal/self-harm/homicidal ideation, psychosis, and paranoia.  Screening conducted by provider this visit AIMS, PHQ 2 & 9 with C-SSRS, and GAD 7 see scores below.  He reports that his appetite is fine and eating without difficulty.  He reports that he continues to smoke marijuana daily.  Educated on adverse effect of marijuana and mental health.  Understanding voiced.   Visit Diagnosis:    ICD-10-CM   1. Schizophrenia, unspecified type (HCC)  F20.9     2. Generalized anxiety disorder  F41.1     3. Substance induced mood disorder (HCC)  F19.94     4. Marijuana abuse, continuous  F12.10       Past Psychiatric History: schizophrenia, bipolar affective disorder, substance induced mood disorder, generalized anxiety disorder, cannabis abuse  Past Medical History:  Past Medical History:  Diagnosis Date   Bipolar affective (HCC)    Schizophrenia (HCC)    History reviewed. No pertinent surgical history.  Family Psychiatric History: Paternal grandfather- alcohol use disorder  Family History: History reviewed. No pertinent family history.  Social History:  Social History   Socioeconomic History   Marital status: Single    Spouse name: Not on  file   Number of children: Not on file   Years of education: Not on file   Highest education level: Not on file  Occupational History   Not on file  Tobacco Use   Smoking status: Unknown   Smokeless tobacco: Not on file  Substance and Sexual Activity   Alcohol use: Yes   Drug use: Yes    Types: Marijuana    Comment: Delta 8   Sexual activity: Not on file  Other Topics Concern   Not on file  Social History Narrative   Not on file   Social Determinants of Health   Financial Resource Strain: Not on file  Food Insecurity: Not on file  Transportation Needs: Not on file  Physical Activity: Not on file  Stress: Not on file  Social Connections: Not on file    Allergies: No Known Allergies  Metabolic Disorder Labs: Lab Results  Component Value Date   HGBA1C 5.3 12/29/2020   MPG 105 12/29/2020   No results found for: "PROLACTIN" Lab Results  Component Value Date   CHOL 138 12/29/2020   TRIG 47 12/29/2020   HDL 52 12/29/2020   CHOLHDL 2.7 12/29/2020   VLDL 9 12/29/2020   LDLCALC 77 12/29/2020   Lab Results  Component Value Date   TSH 2.574 12/29/2020    Therapeutic Level Labs: No results found for: "LITHIUM" No results found for: "VALPROATE" No results found for: "CBMZ"  Current Medications: Current Outpatient Medications  Medication Sig Dispense Refill   ABILIFY MAINTENA 400 MG PRSY prefilled syringe INJECT 400MG  INTRAMUSCULARLY EVERY 28-30 DAYS AS DIRECTED (ADMINISTER WITHIN 30 MINUTES OF RECONSTITUTION)  1 each 0   hydrOXYzine (ATARAX/VISTARIL) 50 MG tablet Take 0.5 tablets (25 mg total) by mouth 3 (three) times daily as needed for anxiety. 90 tablet 2   traZODone (DESYREL) 50 MG tablet Take 1 tablet (50 mg total) by mouth at bedtime as needed for sleep. 30 tablet 2   No current facility-administered medications for this visit.     Musculoskeletal: Strength & Muscle Tone: within normal limits Gait & Station: normal Patient leans: N/A  Psychiatric  Specialty Exam: Review of Systems  Constitutional: Negative.   HENT: Negative.    Eyes: Negative.   Respiratory: Negative.    Cardiovascular: Negative.   Gastrointestinal: Negative.   Genitourinary: Negative.   Musculoskeletal: Negative.   Skin: Negative.   Neurological: Negative.   Hematological: Negative.   Psychiatric/Behavioral:  Negative for agitation, decreased concentration, dysphoric mood, hallucinations, self-injury and suicidal ideas. The patient is not nervous/anxious.     There were no vitals taken for this visit.There is no height or weight on file to calculate BMI.  General Appearance: Casual and Neat  Eye Contact:  Good  Speech:  Clear and Coherent and Normal Rate  Volume:  Normal  Mood:  Euthymic  Affect:  Appropriate and Congruent  Thought Process:  Coherent, Goal Directed, and Descriptions of Associations: Intact  Orientation:  Full (Time, Place, and Person)  Thought Content: Logical   Suicidal Thoughts:  No  Homicidal Thoughts:  No  Memory:  Immediate;   Good Recent;   Good Remote;   Good  Judgement:  Intact  Insight:  Present  Psychomotor Activity:  Normal  Concentration:  Concentration: Good and Attention Span: Good  Recall:  Good  Fund of Knowledge: Good  Language: Good  Akathisia:  No  Handed:  Right  AIMS (if indicated): done  Assets:  Communication Skills Desire for Improvement Housing Leisure Time Physical Health Resilience Social Support Transportation  ADL's:  Intact  Cognition: WNL  Sleep:  Good   Screenings: AIMS    Flowsheet Row Office Visit from 03/22/2022 in [facility] Office Visit from 05/06/2021 in [facility]  AIMS Total Score 0 0      GAD-7    Flowsheet Row Office Visit from 03/22/2022 in [facility] Office Visit from 12/30/2021 in [facility] Office Visit from 05/06/2021 in [facility] Clinical Support from 12/30/2020 in [facility]  Total GAD-7 Score 0 0 0 18      PHQ2-9    Flowsheet Row Office Visit from 03/22/2022 in [facility] Office Visit from 12/30/2021 in [facility] Office Visit from 10/05/2021 in [facility] Office Visit from 05/06/2021 in [facility] Clinical Support from 12/30/2020 in [facility]  PHQ-2 Total Score 0 0 0 0 4  PHQ-9 Total Score -- -- -- -- 13      Flowsheet Row Office Visit from 03/22/2022 in [facility] Office Visit from 12/30/2021 in [facility] Office Visit from 10/05/2021 in [facility]  C-SSRS RISK CATEGORY No Risk No Risk No Risk      Assessment and Plan: Aj Crunkleton appears to be doing well.  He reports that medications are effective and managing his mental health without any adverse reaction.  During visit he is dressed appropriate for weather.  He is  sitting upright in chair with no noted distress.  He is alert/oriented x 4, calm/cooperative and mood is congruent with affect.  He spoke in a clear tone at moderate volume, and normal pace, with good eye contact.  His thought process is coherent and relevant; and there is no indication that he is currently responding to internal/external stimuli, or experiencing delusional thought content.  He denies depression, anxiety, suicidal/self-harm/homicidal ideation, psychosis, paranoia, and fluctuations in mood.   1. Schizophrenia, unspecified type (HCC) - ARIPiprazole ER (ABILIFY MAINTENA) 400 MG PRSY prefilled syringe; INJECT 400MG  INTRAMUSCULARLY EVERY 28-30 DAYS AS DIRECTED (ADMINISTER WITHIN 30 MINUTES OF RECONSTITUTION)  Dispense: 1 each; Refill: 2 - traZODone (DESYREL) 50 MG tablet; Take 1 tablet (50 mg total) by mouth at bedtime as needed  for sleep.  Dispense: 30 tablet; Refill: 2  2. Generalized anxiety disorder - hydrOXYzine (ATARAX) 50 MG tablet; Take 0.5 tablets (25 mg total) by mouth 3 (three) times daily as needed for anxiety.  Dispense: 90 tablet; Refill: 2  3. Substance induced mood disorder (HCC)  4. Marijuana abuse, continuous    Collaboration of Care: Collaboration of Care: Medication Management AEB Refill medications and Psychiatrist AEB Follow up psychiatric evaluation Meds ordered this encounter  Medications   ARIPiprazole ER (ABILIFY MAINTENA) 400 MG PRSY prefilled syringe    Sig: INJECT 400MG  INTRAMUSCULARLY EVERY 28-30 DAYS AS DIRECTED (ADMINISTER WITHIN 30 MINUTES OF RECONSTITUTION)    Dispense:  1 each    Refill:  2    Order Specific Question:   Supervising Provider    Answer:   [[id number]]   traZODone (DESYREL) 50 MG tablet    Sig: Take 1 tablet (50 mg total) by mouth at bedtime as needed for sleep.    Dispense:  30 tablet    Refill:  2    Order Specific Question:   Supervising Provider    Answer:   [[id number]]   hydrOXYzine (ATARAX) 50 MG tablet    Sig: Take 0.5 tablets (25 mg total) by mouth 3 (three) times daily as needed for anxiety.    Dispense:  90 tablet    Refill:  2    Order Specific Question:   Supervising Provider    Answer:   Nelly Rout [[id number]]     Patient/Guardian was advised Release of Information must be obtained prior to any record release in order to collaborate their care with an outside provider. Patient/Guardian was advised if they have not already done so to contact the registration department to sign all necessary forms in order for Nelly Rout to release information regarding their care.   Consent: Patient/Guardian gives verbal consent for treatment and assignment of benefits for services provided during this visit. Patient/Guardian expressed understanding and agreed to proceed.    Kaida Games, NP 03/22/2022, 11:37 AM

## 2022-04-19 ENCOUNTER — Ambulatory Visit (HOSPITAL_COMMUNITY): Payer: No Payment, Other

## 2022-04-26 ENCOUNTER — Telehealth (HOSPITAL_COMMUNITY): Payer: Self-pay | Admitting: *Deleted

## 2022-04-26 NOTE — Telephone Encounter (Signed)
Last seen for his monthly inj on 03/22/22. He was due to return for his next inj on 04/20/22 but did not show. Tried to call him today using both of the listed numbers but when dialed both said my call could not be completed at this time and there was no opportunity to leave a VM.

## 2022-04-30 ENCOUNTER — Other Ambulatory Visit: Payer: Self-pay

## 2022-04-30 ENCOUNTER — Encounter (HOSPITAL_COMMUNITY): Payer: Self-pay | Admitting: Emergency Medicine

## 2022-04-30 ENCOUNTER — Ambulatory Visit (HOSPITAL_COMMUNITY)
Admission: EM | Admit: 2022-04-30 | Discharge: 2022-04-30 | Disposition: A | Discharge status: Home / Self Care | Payer: Self-pay | Attending: Family Medicine | Admitting: Family Medicine

## 2022-04-30 DIAGNOSIS — Z711 Person with feared health complaint in whom no diagnosis is made: Secondary | ICD-10-CM | POA: Insufficient documentation

## 2022-04-30 DIAGNOSIS — L293 Anogenital pruritus, unspecified: Secondary | ICD-10-CM | POA: Insufficient documentation

## 2022-04-30 DIAGNOSIS — Z202 Contact with and (suspected) exposure to infections with a predominantly sexual mode of transmission: Secondary | ICD-10-CM

## 2022-04-30 MED ORDER — CEFTRIAXONE SODIUM 500 MG IJ SOLR
INTRAMUSCULAR | Status: AC
  Filled 2022-04-30: qty 500

## 2022-04-30 MED ORDER — LIDOCAINE HCL (PF) 1 % IJ SOLN
INTRAMUSCULAR | Status: AC
  Filled 2022-04-30: qty 2

## 2022-04-30 MED ORDER — DOXYCYCLINE HYCLATE 100 MG PO CAPS: 100.0000 mg | ORAL_CAPSULE | Freq: Two times a day (BID) | ORAL | 0 refills | Status: AC

## 2022-04-30 MED ORDER — PERMETHRIN 5 % EX CREA: TOPICAL_CREAM | CUTANEOUS | 1 refills | Status: AC

## 2022-04-30 MED ORDER — CEFTRIAXONE SODIUM 500 MG IJ SOLR
500.0000 mg | Freq: Once | INTRAMUSCULAR | Status: AC
  Administered 2022-04-30: 500 mg via INTRAMUSCULAR

## 2022-04-30 NOTE — ED Notes (Signed)
Cytology swab, reviewed for labeling correct.

## 2022-04-30 NOTE — ED Triage Notes (Signed)
Denies penile discharge.  Reports itching., reports bumps and sores.  Onset 2-3 weeks

## 2022-04-30 NOTE — Discharge Instructions (Signed)

## 2022-05-02 LAB — CYTOLOGY, (ORAL, ANAL, URETHRAL) ANCILLARY ONLY
Chlamydia: NEGATIVE
Comment: NEGATIVE
Comment: NEGATIVE
Comment: NORMAL
Neisseria Gonorrhea: NEGATIVE
Trichomonas: NEGATIVE

## 2022-05-02 NOTE — ED Provider Notes (Signed)
  [place]   [id number] 04/30/22 Arrival Time: 6812  ASSESSMENT & PLAN:  1. Concern about STD in male without diagnosis   2. Itching of male genitalia       Discharge Instructions      You have been given the following today for treatment of suspected gonorrhea and/or chlamydia:  cefTRIAXone (ROCEPHIN) injection 500 mg  Please pick up your prescription for doxycycline 100 mg and begin taking twice daily for the next seven (7) days.  Even though we have treated you today, we have sent testing for sexually transmitted infections. We will notify you of any positive results once they are received. If required, we will prescribe any medications you might need.  Please refrain from all sexual activity for at least the next seven days.     Pending: Labs Reviewed  CYTOLOGY, (ORAL, ANAL, URETHRAL) ANCILLARY ONLY    Will notify of any positive results. Instructed to refrain from sexual activity for at least seven days.  Reviewed expectations re: course of current medical issues. Questions answered. Outlined signs and symptoms indicating need for more acute intervention. Patient verbalized understanding. After Visit Summary given.   SUBJECTIVE:  Andres Mccall is a 26 y.o. male who presents with complaint of penile discharge; x 1 week. Describes discharge as thick and opaque. No specific aggravating or alleviating factors reported. Denies: urinary frequency, dysuria, and gross hematuria. Afebrile. No abdominal or pelvic pain. No n/v. No rashes or lesions. Reports that he is sexually active.  OBJECTIVE:  Vitals:   04/30/22 1516  BP: 114/71  Pulse: 85  Resp: 18  Temp: 98.5 F (36.9 C)  TempSrc: Oral  SpO2: 96%     General appearance: alert, cooperative, appears stated age and no distress Throat: lips, mucosa, and tongue normal; teeth and gums normal Lungs: unlabored respirations; speaks full sentences without difficulty Back: no CVA tenderness; FROM at  waist Abdomen: soft, non-tender GU: normal appearing genitalia Skin: warm and dry Psychological: alert and cooperative; normal mood and affect.   Labs Reviewed  CYTOLOGY, (ORAL, ANAL, URETHRAL) ANCILLARY ONLY    Allergies  Allergen Reactions   Gentamicin Hives    Past Medical History:  Diagnosis Date   Bipolar affective ([place])    Schizophrenia ([place])    History reviewed. No pertinent family history. Social History   Socioeconomic History   Marital status: Single    Spouse name: Not on file   Number of children: Not on file   Years of education: Not on file   Highest education level: Not on file  Occupational History   Not on file  Tobacco Use   Smoking status: Every Day    Types: Cigarettes   Smokeless tobacco: Never  Substance and Sexual Activity   Alcohol use: Yes   Drug use: Yes    Types: Marijuana    Comment: Delta 8   Sexual activity: Not on file  Other Topics Concern   Not on file  Social History Narrative   Not on file   Social Determinants of Health   Financial Resource Strain: Not on file  Food Insecurity: Not on file  Transportation Needs: Not on file  Physical Activity: Not on file  Stress: Not on file  Social Connections: Not on file  Intimate Partner Violence: Not on file           Vanessa Kick, MD 05/02/22 [phone]

## 2022-05-03 ENCOUNTER — Encounter (HOSPITAL_COMMUNITY): Payer: Self-pay

## 2022-05-03 ENCOUNTER — Ambulatory Visit (HOSPITAL_COMMUNITY): Payer: No Payment, Other

## 2022-05-03 VITALS — BP 124/81 | HR 80 | Resp 13 | Ht 72.0 in | Wt 163.0 lb

## 2022-05-03 MED ORDER — ARIPIPRAZOLE ER 400 MG IM PRSY
400.0000 mg | PREFILLED_SYRINGE | Freq: Once | INTRAMUSCULAR | Status: AC
  Administered 2022-05-03: 400 mg via INTRAMUSCULAR

## 2022-05-03 NOTE — Progress Notes (Signed)
Patient presents to the office for Abilify Maintena 400 Mg injection in left Deltoid. Patient seems to doing well and will return in 28 days.   

## 2022-05-31 ENCOUNTER — Encounter (HOSPITAL_COMMUNITY): Payer: Self-pay

## 2022-05-31 ENCOUNTER — Ambulatory Visit (INDEPENDENT_AMBULATORY_CARE_PROVIDER_SITE_OTHER): Payer: No Payment, Other

## 2022-05-31 VITALS — BP 110/62 | HR 80 | Resp 12 | Ht 72.0 in | Wt 175.0 lb

## 2022-05-31 DIAGNOSIS — F411 Generalized anxiety disorder: Secondary | ICD-10-CM | POA: Diagnosis not present

## 2022-05-31 DIAGNOSIS — F1994 Other psychoactive substance use, unspecified with psychoactive substance-induced mood disorder: Secondary | ICD-10-CM

## 2022-05-31 DIAGNOSIS — F209 Schizophrenia, unspecified: Secondary | ICD-10-CM | POA: Diagnosis not present

## 2022-05-31 MED ORDER — ARIPIPRAZOLE ER 400 MG IM PRSY
400.0000 mg | PREFILLED_SYRINGE | Freq: Once | INTRAMUSCULAR | Status: AC
  Administered 2022-05-31: 400 mg via INTRAMUSCULAR

## 2022-05-31 NOTE — Progress Notes (Signed)
Patient presents to the office for Abilify Maintena 400 Mg injection in RIGHT Deltoid. Patient seems to doing well and will return in 28 days.

## 2022-06-28 ENCOUNTER — Ambulatory Visit (INDEPENDENT_AMBULATORY_CARE_PROVIDER_SITE_OTHER): Payer: No Payment, Other | Admitting: Psychiatry

## 2022-06-28 ENCOUNTER — Other Ambulatory Visit: Payer: Self-pay

## 2022-06-28 ENCOUNTER — Encounter (HOSPITAL_COMMUNITY): Payer: Self-pay

## 2022-06-28 ENCOUNTER — Ambulatory Visit (HOSPITAL_COMMUNITY): Payer: No Payment, Other

## 2022-06-28 VITALS — BP 181/81 | HR 72 | Resp 13 | Ht 72.0 in | Wt 179.0 lb

## 2022-06-28 DIAGNOSIS — F209 Schizophrenia, unspecified: Secondary | ICD-10-CM | POA: Diagnosis not present

## 2022-06-28 DIAGNOSIS — F411 Generalized anxiety disorder: Secondary | ICD-10-CM

## 2022-06-28 DIAGNOSIS — F1210 Cannabis abuse, uncomplicated: Secondary | ICD-10-CM

## 2022-06-28 DIAGNOSIS — F1994 Other psychoactive substance use, unspecified with psychoactive substance-induced mood disorder: Secondary | ICD-10-CM

## 2022-06-28 MED ORDER — ABILIFY MAINTENA 400 MG IM PRSY: PREFILLED_SYRINGE | INTRAMUSCULAR | 2 refills | Status: DC

## 2022-06-28 MED ORDER — ARIPIPRAZOLE ER 400 MG IM PRSY
400.0000 mg | PREFILLED_SYRINGE | Freq: Once | INTRAMUSCULAR | Status: AC
  Administered 2022-06-28: 400 mg via INTRAMUSCULAR

## 2022-06-28 MED ORDER — TRAZODONE HCL 50 MG PO TABS
50.0000 mg | ORAL_TABLET | Freq: Every evening | ORAL | 3 refills | Status: DC | PRN
  Filled 2022-06-28: qty 30, 30d supply, fill #0

## 2022-06-28 MED ORDER — HYDROXYZINE HCL 50 MG PO TABS
25.0000 mg | ORAL_TABLET | Freq: Three times a day (TID) | ORAL | 3 refills | Status: DC | PRN
  Filled 2022-06-28: qty 90, 60d supply, fill #0

## 2022-06-28 NOTE — Progress Notes (Cosign Needed)
  Patient presents to the office for Abilify Maintena 400 Mg injection in left Deltoid. Patient seems to doing well and will return in 28 days.

## 2022-06-28 NOTE — Progress Notes (Signed)
BH MD/PA/NP OP Progress Note  06/28/2022 1:37 PM Andres Mccall  MRN:  [id number]  Chief Complaint: " I have not been sleeping well"  HPI: 26 year old male seen today for follow-up psychiatric evaluation.   He has a psychiatric history of depression, anxiety, marijuana use, and schizophrenia.  He is currently managed on Abilify 400 mg monthly, hydroxyzine 25 to 50 mg daily as needed, and trazodone 50 mg nightly as needed.  He notes his medications are effective in managing his psychiatric conditions however notes that he has been out of some of them.  Today he is well-groomed, pleasant, cooperative, and somewhat engaged in conversation.  During exam patient was observed with his 2 young children.  He was pleasant, cooperative, and engaged in conversation.  He informed Clinical research associate that mentally he feels well however notes that he is not sleeping well.  Patient notes that he sleeps approximately 5 hours nightly.  He does note that his anxiety and depression are well managed.  Provider conducted a GAD-7 and patient scored a 4.  Provider also conducted PHQ-9 and patient scored a 6.  He endorses adequate appetite.  Today he denies SI/HI/VAH, mania, paranoia.   Today patient is agreeable to restarting trazodone 50 mg nightly as needed and hydroxyzine 25 to 50 mg 3 times daily as needed.  He will continue to receive Abilify Maintena injections monthly.  Patient [facility] so he can afford them.  No other concerns at this time.   Visit Diagnosis:    ICD-10-CM   1. Schizophrenia, unspecified type (HCC)  F20.9 ARIPiprazole ER (ABILIFY MAINTENA) 400 MG PRSY prefilled syringe    traZODone (DESYREL) 50 MG tablet    2. Generalized anxiety disorder  F41.1 hydrOXYzine (ATARAX) 50 MG tablet      Past Psychiatric History:  Depression, anxiety, marijuana use, and schizophrenia  Depression, anxiety, marijuana use, and schizophrenia  Depression, anxiety, marijuana  use, and schizophrenia   Past Medical History:  Past Medical History:  Diagnosis Date   Bipolar affective (HCC)    Schizophrenia (HCC)    No past surgical history on file.  Family Psychiatric History:  Denies   Family History: No family history on file.  Social History:  Social History   Socioeconomic History   Marital status: Single    Spouse name: Not on file   Number of children: Not on file   Years of education: Not on file   Highest education level: Not on file  Occupational History   Not on file  Tobacco Use   Smoking status: Every Day    Types: Cigarettes   Smokeless tobacco: Never  Substance and Sexual Activity   Alcohol use: Yes   Drug use: Yes    Types: Marijuana    Comment: Delta 8   Sexual activity: Not on file  Other Topics Concern   Not on file  Social History Narrative   Not on file   Social Determinants of Health   Financial Resource Strain: Not on file  Food Insecurity: Not on file  Transportation Needs: Not on file  Physical Activity: Not on file  Stress: Not on file  Social Connections: Not on file    Allergies:  Allergies  Allergen Reactions   Gentamicin Hives    Metabolic Disorder Labs: Lab Results  Component Value Date   HGBA1C 5.3 12/29/2020   MPG 105 12/29/2020   No results found for: "PROLACTIN" Lab Results  Component Value Date  CHOL 138 12/29/2020   TRIG 47 12/29/2020   HDL 52 12/29/2020   CHOLHDL 2.7 12/29/2020   VLDL 9 12/29/2020   LDLCALC 77 12/29/2020   Lab Results  Component Value Date   TSH 2.574 12/29/2020    Therapeutic Level Labs: No results found for: "LITHIUM" No results found for: "VALPROATE" No results found for: "CBMZ"  Current Medications: Current Outpatient Medications  Medication Sig Dispense Refill   ARIPiprazole ER (ABILIFY MAINTENA) 400 MG PRSY prefilled syringe INJECT 400MG  INTRAMUSCULARLY EVERY 28-30 DAYS AS DIRECTED (ADMINISTER WITHIN 30 MINUTES OF RECONSTITUTION) 1 each 2    doxycycline (VIBRAMYCIN) 100 MG capsule Take 1 capsule (100 mg total) by mouth 2 (two) times daily. 14 capsule 0   hydrOXYzine (ATARAX) 50 MG tablet Take 0.5 tablets (25 mg total) by mouth 3 (three) times daily as needed for anxiety. 90 tablet 3   permethrin (ELIMITE) 5 % cream Apply from neck down before bed then wash off in the morning. May repeat in one week. 60 g 1   traZODone (DESYREL) 50 MG tablet Take 1 tablet (50 mg total) by mouth at bedtime as needed for sleep. 30 tablet 3   No current facility-administered medications for this visit.     Musculoskeletal: Strength & Muscle Tone: within normal limits Gait & Station: normal Patient leans: N/A  Psychiatric Specialty Exam: Review of Systems  There were no vitals taken for this visit.There is no height or weight on file to calculate BMI.  General Appearance: Well Groomed  Eye Contact:  Good  Speech:  Clear and Coherent and Normal Rate  Volume:  Normal  Mood:  Euthymic  Affect:  Appropriate and Congruent  Thought Process:  Coherent, Goal Directed, and Linear  Orientation:  Full (Time, Place, and Person)  Thought Content: WDL   Suicidal Thoughts:  No  Homicidal Thoughts:  No  Memory:  Immediate;   Good Recent;   Good Remote;   Good  Judgement:  Good  Insight:  Good  Psychomotor Activity:  Normal  Concentration:  Concentration: Good and Attention Span: Good  Recall:  Good  Fund of Knowledge: Good  Language: Good  Akathisia:  No  Handed:  Right  AIMS (if indicated): Done 0  Assets:  Communication Skills Desire for Improvement Financial Resources/Insurance Housing Leisure Time Physical Health Social Support  ADL's:  Intact  Cognition: WNL  Sleep:  Fair   Screenings: Office Visit from 03/22/2022 in [facility] Office Visit from 05/06/2021 in [facility]  AIMS Total Score 0 0      GAD-7    Flowsheet Row Office Visit from 03/22/2022  in [facility] Office Visit from 12/30/2021 in [facility] Office Visit from 05/06/2021 in [facility] Clinical Support from 12/30/2020 in [facility]  Total GAD-7 Score 0 0 0 18      PHQ2-9    Flowsheet Row Office Visit from 03/22/2022 in [facility] Office Visit from 12/30/2021 in [facility] Office Visit from 10/05/2021 in [facility] Office Visit from 05/06/2021 in [facility] Clinical Support from 12/30/2020 in [facility]  PHQ-2 Total Score 0 0 0 0 4  PHQ-9 Total Score -- -- -- -- 63      Flowsheet Row ED from 04/30/2022 in [facility]  at [facility] Visit from 03/22/2022 in [facility] Office Visit from 12/30/2021 in [facility]  C-SSRS RISK CATEGORY No Risk No Risk No Risk        Assessment and Plan: Patient reports that his anxiety, depression, and mood are well managed.  He however notes that he has issues sleeping.  Patient notes that he has been without his trazodone for a while and is agreeable to restarting it today.  No medication changes made today.  Patient agreeable to continue medications as prescribed.  1. Schizophrenia, unspecified type (HCC)  Continue- ARIPiprazole ER (ABILIFY MAINTENA) 400 MG PRSY prefilled syringe; INJECT 400MG  INTRAMUSCULARLY EVERY 28-30 DAYS AS DIRECTED (ADMINISTER WITHIN 30 MINUTES OF RECONSTITUTION)  Dispense: 1 each; Refill: 2 Restart- traZODone (DESYREL) 50 MG tablet; Take 1 tablet (50 mg total) by mouth at bedtime as needed for sleep.  Dispense: 30 tablet; Refill: 3  2. Generalized anxiety disorder  Restart- hydrOXYzine (ATARAX) 50 MG tablet; Take 0.5 tablets (25 mg total) by mouth 3 (three) times daily as needed for  anxiety.  Dispense: 90 tablet; Refill: 3   Collaboration of Care: Collaboration of Care: Other provider involved in patient's care AEB shot clinic staff and PCP  Patient/Guardian was advised Release of Information must be obtained prior to any record release in order to collaborate their care with an outside provider. Patient/Guardian was advised if they have not already done so to contact the registration department to sign all necessary forms in order for to release information regarding their care.   Consent: Patient/Guardian gives verbal consent for treatment and assignment of benefits for services provided during this visit. Patient/Guardian expressed understanding and agreed to proceed.    [place], NP 06/28/2022, 1:37 PM

## 2022-07-05 ENCOUNTER — Other Ambulatory Visit: Payer: Self-pay

## 2022-07-28 ENCOUNTER — Encounter (HOSPITAL_COMMUNITY): Payer: Self-pay

## 2022-07-28 ENCOUNTER — Other Ambulatory Visit: Payer: Self-pay

## 2022-07-28 ENCOUNTER — Ambulatory Visit (HOSPITAL_COMMUNITY): Payer: No Payment, Other

## 2022-07-28 ENCOUNTER — Other Ambulatory Visit (HOSPITAL_COMMUNITY): Payer: Self-pay | Admitting: Registered Nurse

## 2022-07-28 VITALS — BP 135/84 | HR 77 | Ht 72.0 in | Wt 179.0 lb

## 2022-07-28 DIAGNOSIS — F209 Schizophrenia, unspecified: Secondary | ICD-10-CM

## 2022-07-28 DIAGNOSIS — F411 Generalized anxiety disorder: Secondary | ICD-10-CM

## 2022-07-28 DIAGNOSIS — F1994 Other psychoactive substance use, unspecified with psychoactive substance-induced mood disorder: Secondary | ICD-10-CM

## 2022-07-28 DIAGNOSIS — G4700 Insomnia, unspecified: Secondary | ICD-10-CM

## 2022-07-28 DIAGNOSIS — F1210 Cannabis abuse, uncomplicated: Secondary | ICD-10-CM

## 2022-07-28 MED ORDER — ARIPIPRAZOLE ER 400 MG IM PRSY
400.0000 mg | PREFILLED_SYRINGE | Freq: Once | INTRAMUSCULAR | Status: AC
  Administered 2022-07-28: 400 mg via INTRAMUSCULAR

## 2022-07-28 MED ORDER — TRAZODONE HCL 50 MG PO TABS
50.0000 mg | ORAL_TABLET | Freq: Every evening | ORAL | 3 refills | Status: DC | PRN
  Filled 2022-07-28: qty 60, 30d supply, fill #0

## 2022-07-28 NOTE — Progress Notes (Signed)
Patient presents to the office for Abilify Maintena 400 Mg injection in left Deltoid. Patient seems to doing well and will return in 28 days.   

## 2022-08-03 ENCOUNTER — Other Ambulatory Visit: Payer: Self-pay

## 2022-08-23 ENCOUNTER — Ambulatory Visit (INDEPENDENT_AMBULATORY_CARE_PROVIDER_SITE_OTHER): Payer: No Payment, Other | Admitting: Psychiatry

## 2022-08-23 ENCOUNTER — Ambulatory Visit (INDEPENDENT_AMBULATORY_CARE_PROVIDER_SITE_OTHER): Payer: No Payment, Other

## 2022-08-23 ENCOUNTER — Encounter (HOSPITAL_COMMUNITY): Payer: Self-pay

## 2022-08-23 VITALS — BP 153/91 | HR 78 | Ht 72.0 in | Wt 290.0 lb

## 2022-08-23 DIAGNOSIS — F1210 Cannabis abuse, uncomplicated: Secondary | ICD-10-CM

## 2022-08-23 DIAGNOSIS — G4700 Insomnia, unspecified: Secondary | ICD-10-CM

## 2022-08-23 DIAGNOSIS — F411 Generalized anxiety disorder: Secondary | ICD-10-CM | POA: Diagnosis not present

## 2022-08-23 DIAGNOSIS — F209 Schizophrenia, unspecified: Secondary | ICD-10-CM

## 2022-08-23 DIAGNOSIS — F1994 Other psychoactive substance use, unspecified with psychoactive substance-induced mood disorder: Secondary | ICD-10-CM

## 2022-08-23 DIAGNOSIS — F1490 Cocaine use, unspecified, uncomplicated: Secondary | ICD-10-CM

## 2022-08-23 MED ORDER — ARIPIPRAZOLE ER 400 MG IM PRSY
400.0000 mg | PREFILLED_SYRINGE | Freq: Once | INTRAMUSCULAR | Status: AC
  Administered 2022-08-23: 400 mg via INTRAMUSCULAR

## 2022-08-23 MED ORDER — QUETIAPINE FUMARATE 50 MG PO TABS: 50.0000 mg | ORAL_TABLET | Freq: Every day | ORAL | 3 refills | Status: DC

## 2022-08-23 MED ORDER — ABILIFY MAINTENA 400 MG IM PRSY: PREFILLED_SYRINGE | INTRAMUSCULAR | 2 refills | Status: DC

## 2022-08-23 MED ORDER — HYDROXYZINE HCL 50 MG PO TABS: 25.0000 mg | ORAL_TABLET | Freq: Three times a day (TID) | ORAL | 3 refills | Status: DC | PRN

## 2022-08-23 MED ORDER — TRAZODONE HCL 50 MG PO TABS: 50.0000 mg | ORAL_TABLET | Freq: Every evening | ORAL | 3 refills | Status: DC | PRN

## 2022-08-23 NOTE — Progress Notes (Signed)
BH MD/PA/NP OP Progress Note  08/23/2022 9:50 AM Andres Mccall  MRN:  [id number]  Chief Complaint: "I can't sleep"  HPI: 27 year old male walked into the clinic psychiatric evaluation.   He has a psychiatric history of depression, anxiety, marijuana use, and schizophrenia.  He is currently managed on Abilify 400 mg monthly, hydroxyzine 50 mg daily as needed, and trazodone 50 mg nightly as needed.  He notes his medications are somewhat effective in managing his psychiatric conditions.  Today he is well-groomed, lethargic, and somewhat engaged in conversation.  Patient could not stay awake during exam. He notes that he sleeps less than three hours nightly. Patient reports that he uses cocaine and marijuana several times a week. Provider informed patient that cocaine and marijuana can cause poor sleep. He endorsed understanding but notes that he is not ready to discontinue his substances. Provider attempted to give patient resources to substance use facilities but at he notes that he is not interested. Patient reports that due to lack of sleep he has been more irritable. He also notes that he has been having fluctuations in mood and increased irritability. Patient reports that he slept in a hotel last night instead of his mothers home because of his increased irritability. Provider asked to speak to patient mother for collateral however he was not agreeable. Today he denies S/HI/VAH or paranoia. He endorses having an adequate appetite.   Patient unable to do a GAD 7 and a PHQ 9 due to him falling asleep during the exam.   Patient is 5 days early for his injection however request that it be administered to help his mood. Provider was agreeable to this and asked patient if he was agreeable to starting Seroquel 50 mg to help manage mood and sleep. He was agreeable to this.  He will continue all other medications as prescribed. No other concerns noted at this time.      Visit Diagnosis:    ICD-10-CM   1.  Schizophrenia, unspecified type (HCC)  F20.9 QUEtiapine (SEROQUEL) 50 MG tablet    ARIPiprazole ER (ABILIFY MAINTENA) 400 MG PRSY prefilled syringe    traZODone (DESYREL) 50 MG tablet    2. Generalized anxiety disorder  F41.1 QUEtiapine (SEROQUEL) 50 MG tablet    hydrOXYzine (ATARAX) 50 MG tablet    3. Insomnia, unspecified type  G47.00 QUEtiapine (SEROQUEL) 50 MG tablet    traZODone (DESYREL) 50 MG tablet       Past Psychiatric History:  Depression, anxiety, marijuana use, and schizophrenia  Depression, anxiety, marijuana use, and schizophrenia  Depression, anxiety, marijuana use, and schizophrenia   Past Medical History:  Past Medical History:  Diagnosis Date   Bipolar affective (HCC)    Schizophrenia (HCC)    No past surgical history on file.  Family Psychiatric History:  Denies   Family History: No family history on file.  Social History:  Social History   Socioeconomic History   Marital status: Single    Spouse name: Not on file   Number of children: Not on file   Years of education: Not on file   Highest education level: Not on file  Occupational History   Not on file  Tobacco Use   Smoking status: Every Day    Types: Cigarettes   Smokeless tobacco: Never  Substance and Sexual Activity   Alcohol use: Yes   Drug use: Yes    Types: Marijuana    Comment: Delta 8   Sexual activity: Not on file  Other Topics  Concern   Not on file  Social History Narrative   Not on file   Social Determinants of Health   Financial Resource Strain: Not on file  Food Insecurity: Not on file  Transportation Needs: Not on file  Physical Activity: Not on file  Stress: Not on file  Social Connections: Not on file    Allergies:  Allergies  Allergen Reactions   Gentamicin Hives    Metabolic Disorder Labs: Lab Results  Component Value Date   HGBA1C 5.3 12/29/2020   MPG 105 12/29/2020   No results found for: "PROLACTIN" Lab Results  Component Value Date   CHOL 138  12/29/2020   TRIG 47 12/29/2020   HDL 52 12/29/2020   CHOLHDL 2.7 12/29/2020   VLDL 9 12/29/2020   LDLCALC 77 12/29/2020   Lab Results  Component Value Date   TSH 2.574 12/29/2020    Therapeutic Level Labs: No results found for: "LITHIUM" No results found for: "VALPROATE" No results found for: "CBMZ"  Current Medications: Current Outpatient Medications  Medication Sig Dispense Refill   ARIPiprazole ER (ABILIFY MAINTENA) 400 MG PRSY prefilled syringe INJECT 400MG  INTRAMUSCULARLY EVERY 28-30 DAYS AS DIRECTED (ADMINISTER WITHIN 30 MINUTES OF RECONSTITUTION) 1 each 2   doxycycline (VIBRAMYCIN) 100 MG capsule Take 1 capsule (100 mg total) by mouth 2 (two) times daily. 14 capsule 0   hydrOXYzine (ATARAX) 50 MG tablet Take 0.5 tablets (25 mg total) by mouth 3 (three) times daily as needed for anxiety. 90 tablet 3   permethrin (ELIMITE) 5 % cream Apply from neck down before bed then wash off in the morning. May repeat in one week. 60 g 1   QUEtiapine (SEROQUEL) 50 MG tablet Take 1 tablet (50 mg total) by mouth at bedtime. 30 tablet 3   traZODone (DESYREL) 50 MG tablet Take 1 tablet (50 mg total) by mouth at bedtime and may repeat dose one time if needed. 60 tablet 3   No current facility-administered medications for this visit.     Musculoskeletal: Strength & Muscle Tone: within normal limits Gait & Station: normal Patient leans: N/A  Psychiatric Specialty Exam: Review of Systems  There were no vitals taken for this visit.There is no height or weight on file to calculate BMI.  General Appearance: Well Groomed  Eye Contact:  Poor  Speech:  Clear and Coherent and Slow  Volume:  Decreased  Mood:  Euthymic  Affect:  Flat  Thought Process:  Coherent, Goal Directed, and Linear  Orientation:  Full (Time, Place, and Person)  Thought Content: WDL   Suicidal Thoughts:  No  Homicidal Thoughts:  No  Memory:  Immediate;   Good Recent;   Good Remote;   Good  Judgement:  Fair  Insight:   Good and Fair  Psychomotor Activity:  Normal  Concentration:  Concentration: Fair and Attention Span: Fair  Recall:  Good  Fund of Knowledge: Good  Language: Good  Akathisia:  No  Handed:  Right  AIMS (if indicated): Not Done   Assets:  Communication Skills Desire for Improvement Financial Resources/Insurance Housing Leisure Time Physical Health Social Support  ADL's:  Intact  Cognition: WNL  Sleep:  Poor   Screenings: AIMS    Flowsheet Row Office Visit from 03/22/2022 in [facility] Office Visit from 05/06/2021 in [place] Total Score 0 0      GAD-7    [place] Office Visit from 03/22/2022 in [facility]  Office Visit from 12/30/2021 in [facility] Office Visit from 05/06/2021 in [place] from 12/30/2020 in [facility]  Total GAD-7 Score 0 0 0 [place] Office Visit from 03/22/2022 in [facility] Office Visit from 12/30/2021 in [facility] Office Visit from 10/05/2021 in [facility] Office Visit from 05/06/2021 in [place] from 12/30/2020 in [facility]  PHQ-2 Total Score 0 0 0 0 4  PHQ-9 Total Score -- -- -- -- [place] [facility] from 04/30/2022 in [facility] [place] from 03/22/2022 in [facility] Office Visit from 12/30/2021 in [place] No Risk No Risk No Risk        Assessment and Plan: Patient endorses insomnia, marijuana use, cocaine use, and mood fluctuations/irritability. Today he is agreeable to starting Seroquel 50 mg to help manage mood and sleep.   1.  Schizophrenia, unspecified type ([place])  Start- QUEtiapine (SEROQUEL) 50 MG tablet; Take 1 tablet (50 mg total) by mouth at bedtime.  Dispense: 30 tablet; Refill: 3 Continue- ARIPiprazole ER (ABILIFY MAINTENA) 400 MG PRSY prefilled syringe; INJECT 400MG  INTRAMUSCULARLY EVERY 28-30 DAYS AS DIRECTED (ADMINISTER WITHIN 30 MINUTES OF RECONSTITUTION)  Dispense: 1 each; Refill: 2 Continue- traZODone (DESYREL) 50 MG tablet; Take 1 tablet (50 mg total) by mouth at bedtime and may repeat dose one time if needed.  Dispense: 60 tablet; Refill: 3  2. Generalized anxiety disorder  Start- QUEtiapine (SEROQUEL) 50 MG tablet; Take 1 tablet (50 mg total) by mouth at bedtime.  Dispense: 30 tablet; Refill: 3 Continue- hydrOXYzine (ATARAX) 50 MG tablet; Take 0.5 tablets (25 mg total) by mouth 3 (three) times daily as needed for anxiety.  Dispense: 90 tablet; Refill: 3  3. Insomnia, unspecified type  Start- QUEtiapine (SEROQUEL) 50 MG tablet; Take 1 tablet (50 mg total) by mouth at bedtime.  Dispense: 30 tablet; Refill: 3 Continue- traZODone (DESYREL) 50 MG tablet; Take 1 tablet (50 mg total) by mouth at bedtime and may repeat dose one time if needed.  Dispense: 60 tablet; Refill: 3  4. Cocaine use   5. Marijuana abuse, continuous      Follow up in 3 months Follow up in one month with shot clinic   Salley Slaughter, NP 08/23/2022, 9:50 AM

## 2022-08-23 NOTE — Progress Notes (Signed)
Patient presents to the office for Abilify Maintena 400 Mg injection in left Deltoid. Patient seems to doing well and will return in 28 days.   

## 2022-09-20 ENCOUNTER — Encounter (HOSPITAL_COMMUNITY): Payer: Self-pay

## 2022-09-20 ENCOUNTER — Ambulatory Visit (INDEPENDENT_AMBULATORY_CARE_PROVIDER_SITE_OTHER): Payer: No Typology Code available for payment source

## 2022-09-20 ENCOUNTER — Other Ambulatory Visit (HOSPITAL_COMMUNITY): Payer: Self-pay | Admitting: Psychiatry

## 2022-09-20 VITALS — BP 118/101 | HR 97 | Ht 72.0 in | Wt 175.8 lb

## 2022-09-20 DIAGNOSIS — F1490 Cocaine use, unspecified, uncomplicated: Secondary | ICD-10-CM

## 2022-09-20 DIAGNOSIS — F411 Generalized anxiety disorder: Secondary | ICD-10-CM | POA: Diagnosis not present

## 2022-09-20 DIAGNOSIS — G4700 Insomnia, unspecified: Secondary | ICD-10-CM

## 2022-09-20 MED ORDER — ARIPIPRAZOLE ER 400 MG IM PRSY
400.0000 mg | PREFILLED_SYRINGE | Freq: Once | INTRAMUSCULAR | Status: AC
  Administered 2022-09-20: 400 mg via INTRAMUSCULAR

## 2022-09-20 MED ORDER — TASIMELTEON 20 MG PO CAPS: 20.0000 mg | ORAL_CAPSULE | Freq: Every evening | ORAL | 3 refills | Status: DC

## 2022-09-20 NOTE — Telephone Encounter (Signed)
Patient informed Probation officer that he has been having poor sleep. He notes that Seroquel also makes his body stiff. No abnormal bodily movements noted today. Patient is prescribed Haldol 50 mg but notes that he takes 100 mg to help manage sleep. Today Seroquel discontinued. Herlioz 20 mg nightly started. Potential side effects of medication and risks vs benefits of treatment vs non-treatment were explained and discussed. All questions were answered. No other concerns noted at this time.

## 2022-09-20 NOTE — Progress Notes (Signed)
Patient presents to the office for Abilify Maintena 400 Mg injection in left Deltoid. Patient seems to doing well and will return in 28 days.

## 2022-09-27 ENCOUNTER — Ambulatory Visit (HOSPITAL_COMMUNITY): Payer: No Typology Code available for payment source | Admitting: Psychiatry

## 2022-10-20 ENCOUNTER — Ambulatory Visit (HOSPITAL_COMMUNITY): Payer: No Typology Code available for payment source

## 2022-10-21 ENCOUNTER — Telehealth (HOSPITAL_COMMUNITY): Payer: Self-pay | Admitting: *Deleted

## 2022-10-21 NOTE — Telephone Encounter (Signed)
Mother called states that patient is in detention center in [facility] and in need of his Abilify Maintena 400mg . Asking if she can take his medication to the facility for the nurse there to give. Called the detention center at [phone] spoke with Billy Fischer, RN who is agreeable to administering the medication as prescribed. Called to verify with Beather Arbour and Jiles Garter the pharmacist that it is OK to give medication to mother to transport to patient's nurse. [facility] is on patient assistance and medication was delivered to this facility. Mother will pick up and deliver on Monday.
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
## 2022-11-17 ENCOUNTER — Telehealth (HOSPITAL_COMMUNITY): Payer: Self-pay | Admitting: *Deleted

## 2022-11-17 NOTE — Telephone Encounter (Signed)
VM from patient who is in [facility] asking that we provide a rx for his Trazodone for his mom to pick up. Unable to do so, he is not under our care, there is medical services at the detention center, he has been provided his inj as [place] not on the countys med list and he does get patient assistance for it here thru the county. Mom made aware we can not send over rx he needs [facility]. His shot is here for her to pick and she said his girlfriend will pick it up tomorrow.

## 2022-12-20 ENCOUNTER — Encounter (HOSPITAL_COMMUNITY): Payer: Self-pay

## 2022-12-20 ENCOUNTER — Ambulatory Visit (INDEPENDENT_AMBULATORY_CARE_PROVIDER_SITE_OTHER): Payer: No Typology Code available for payment source

## 2022-12-20 ENCOUNTER — Ambulatory Visit (INDEPENDENT_AMBULATORY_CARE_PROVIDER_SITE_OTHER): Payer: No Typology Code available for payment source | Admitting: Psychiatry

## 2022-12-20 VITALS — BP 140/90 | HR 112 | Resp 16 | Ht 72.0 in | Wt 176.0 lb

## 2022-12-20 DIAGNOSIS — G4700 Insomnia, unspecified: Secondary | ICD-10-CM

## 2022-12-20 DIAGNOSIS — F411 Generalized anxiety disorder: Secondary | ICD-10-CM

## 2022-12-20 DIAGNOSIS — F1994 Other psychoactive substance use, unspecified with psychoactive substance-induced mood disorder: Secondary | ICD-10-CM

## 2022-12-20 DIAGNOSIS — F209 Schizophrenia, unspecified: Secondary | ICD-10-CM

## 2022-12-20 DIAGNOSIS — F200 Paranoid schizophrenia: Secondary | ICD-10-CM

## 2022-12-20 DIAGNOSIS — F1210 Cannabis abuse, uncomplicated: Secondary | ICD-10-CM | POA: Diagnosis not present

## 2022-12-20 DIAGNOSIS — F1490 Cocaine use, unspecified, uncomplicated: Secondary | ICD-10-CM

## 2022-12-20 MED ORDER — QUETIAPINE FUMARATE 50 MG PO TABS: 50.0000 mg | ORAL_TABLET | Freq: Every day | ORAL | 3 refills | Status: DC

## 2022-12-20 MED ORDER — ABILIFY MAINTENA 400 MG IM PRSY: PREFILLED_SYRINGE | INTRAMUSCULAR | 2 refills | Status: DC

## 2022-12-20 MED ORDER — TRAZODONE HCL 50 MG PO TABS: 50.0000 mg | ORAL_TABLET | Freq: Every evening | ORAL | 3 refills | Status: DC | PRN

## 2022-12-20 MED ORDER — HYDROXYZINE HCL 50 MG PO TABS: 25.0000 mg | ORAL_TABLET | Freq: Three times a day (TID) | ORAL | 3 refills | Status: DC | PRN

## 2022-12-20 MED ORDER — ARIPIPRAZOLE ER 400 MG IM PRSY
400.0000 mg | PREFILLED_SYRINGE | Freq: Once | INTRAMUSCULAR | Status: AC
  Administered 2022-12-20: 400 mg via INTRAMUSCULAR

## 2022-12-20 NOTE — Progress Notes (Cosign Needed)
Patient presents to the office for Abilify Maintena 400 Mg injection in RIGHT Deltoid. Patient seems to doing well and will return in 28 days. Pt states he is in a better mental place at this time

## 2022-12-20 NOTE — Progress Notes (Signed)
BH MD/PA/NP OP Progress Note  12/20/2022 4:57 PM Andres Mccall  MRN:  [id number]  Chief Complaint: "I am worried about court"  HPI: 27 year old male walked into the clinic psychiatric evaluation.   He has a psychiatric history of depression, anxiety, marijuana use, and schizophrenia.  He is currently managed on Abilify 400 mg monthly, hydroxyzine 50 mg daily as needed, Seroquel 50 mg nightly, and trazodone 50 mg nightly as needed.  He notes his medications are somewhat effective in managing his psychiatric conditions.  Today he is well-groomed, pleasant, cooperative, and engaged in conversation.  He informed Clinical research associate that he is worried about his upcoming court cases.  Patient was incarcerated in January and notes that he was released a little over 3 weeks ago.  Patient informed Clinical research associate that he was given his Abilify injection while incarcerated.  He informed Clinical research associate that he had 7 charges but four were dropped.  He notes that his upcoming court case in a month will determine if he has to spend more time behind bars.  Patient notes that he does not be away from his son and daughter.  He informed Clinical research associate that he has been trying to focus on them and bettering himself.  Since his last visit he notes that his anxiety and depression has somewhat increased.  Today provider conducted GAD-7 and patient scored an 11.  Provider also conducted PHQ-9 and patient scored a 17.  He notes that his appetite is reduced.  Patient endorses adequate sleep.  Today denies SI/HI/VAH or paranoia.   Since being released from jail patient notes that he has been smoking marijuana daily.  Provider informed patient that marijuana can exacerbate his mental health.  He endorsed understanding however notes that it helps him.  Patient informed Clinical research associate that he has not utilize cocaine.    Today provider conducted an aims assessment and patient scored a 0.  Patient informed writer that his mood is more stable when on both Seroquel and Abilify.  He  reports that Abilify wore off prior to him coming in.  Patient's mother also reports that he was less irritable and depressed.  Today patient agreeable to restarting Seroquel 50 mg to help manage mood.  He will continue all other medications as prescribed.  Patient feels not restarted.  No other concerns or this time.     Visit Diagnosis:    ICD-10-CM   1. Schizophrenia, unspecified type (HCC)  F20.9 ARIPiprazole ER (ABILIFY MAINTENA) 400 MG PRSY prefilled syringe    traZODone (DESYREL) 50 MG tablet    QUEtiapine (SEROQUEL) 50 MG tablet    2. Generalized anxiety disorder  F41.1 hydrOXYzine (ATARAX) 50 MG tablet    QUEtiapine (SEROQUEL) 50 MG tablet    3. Insomnia, unspecified type  G47.00 traZODone (DESYREL) 50 MG tablet    QUEtiapine (SEROQUEL) 50 MG tablet    4. Marijuana abuse, continuous  F12.10        Past Psychiatric History:  Depression, anxiety, marijuana use, and schizophrenia  Depression, anxiety, marijuana use, and schizophrenia  Depression, anxiety, marijuana use, and schizophrenia   Past Medical History:  Past Medical History:  Diagnosis Date   Bipolar affective (HCC)    Schizophrenia (HCC)    No past surgical history on file.  Family Psychiatric History:  Denies   Family History: No family history on file.  Social History:  Social History   Socioeconomic History   Marital status: Single    Spouse name: Not on file   Number of children: Not  on file   Years of education: Not on file   Highest education level: Not on file  Occupational History   Not on file  Tobacco Use   Smoking status: Every Day    Types: Cigarettes   Smokeless tobacco: Never  Substance and Sexual Activity   Alcohol use: Yes   Drug use: Yes    Types: Marijuana    Comment: Delta 8   Sexual activity: Not on file  Other Topics Concern   Not on file  Social History Narrative   Not on file   Social Determinants of Health   Financial Resource Strain: Not on file  Food Insecurity:  Not on file  Transportation Needs: Not on file  Physical Activity: Not on file  Stress: Not on file  Social Connections: Not on file    Allergies:  Allergies  Allergen Reactions   Gentamicin Hives    Metabolic Disorder Labs: Lab Results  Component Value Date   HGBA1C 5.3 12/29/2020   MPG 105 12/29/2020   No results found for: "PROLACTIN" Lab Results  Component Value Date   CHOL 138 12/29/2020   TRIG 47 12/29/2020   HDL 52 12/29/2020   CHOLHDL 2.7 12/29/2020   VLDL 9 12/29/2020   LDLCALC 77 12/29/2020   Lab Results  Component Value Date   TSH 2.574 12/29/2020    Therapeutic Level Labs: No results found for: "LITHIUM" No results found for: "VALPROATE" No results found for: "CBMZ"  Current Medications: Current Outpatient Medications  Medication Sig Dispense Refill   ARIPiprazole ER (ABILIFY MAINTENA) 400 MG PRSY prefilled syringe INJECT 400MG  INTRAMUSCULARLY EVERY 28-30 DAYS AS DIRECTED (ADMINISTER WITHIN 30 MINUTES OF RECONSTITUTION) 1 each 2   doxycycline (VIBRAMYCIN) 100 MG capsule Take 1 capsule (100 mg total) by mouth 2 (two) times daily. 14 capsule 0   hydrOXYzine (ATARAX) 50 MG tablet Take 0.5 tablets (25 mg total) by mouth 3 (three) times daily as needed for anxiety. 90 tablet 3   permethrin (ELIMITE) 5 % cream Apply from neck down before bed then wash off in the morning. May repeat in one week. 60 g 1   QUEtiapine (SEROQUEL) 50 MG tablet Take 1 tablet (50 mg total) by mouth at bedtime. 30 tablet 3   Tasimelteon (HETLIOZ) 20 MG CAPS Take 20 mg by mouth at bedtime. 30 capsule 3   traZODone (DESYREL) 50 MG tablet Take 1 tablet (50 mg total) by mouth at bedtime and may repeat dose one time if needed. 60 tablet 3   No current facility-administered medications for this visit.     Musculoskeletal: Strength & Muscle Tone: within normal limits Gait & Station: normal Patient leans: N/A  Psychiatric Specialty Exam: Review of Systems  There were no vitals taken  for this visit.There is no height or weight on file to calculate BMI.  General Appearance: Well Groomed  Eye Contact:  Good  Speech:  Clear and Coherent and Slow  Volume:  Normal  Mood:  Anxious and Depressed  Affect:  Appropriate and Congruent  Thought Process:  Coherent, Goal Directed, and Linear  Orientation:  Full (Time, Place, and Person)  Thought Content: WDL   Suicidal Thoughts:  No  Homicidal Thoughts:  No  Memory:  Immediate;   Good Recent;   Good Remote;   Good  Judgement:  Fair  Insight:  Good and Fair  Psychomotor Activity:  Normal  Concentration:  Concentration: Good and Attention Span: Good  Recall:  Good  Fund of Knowledge: Good  Language: Good  Akathisia:  No  Handed:  Right  AIMS (if indicated): Not Done   Assets:  Communication Skills Desire for Improvement Financial Resources/Insurance Housing Leisure Time Physical Health Social Support  ADL's:  Intact  Cognition: WNL  Sleep:  Good   Screenings: AIMS    Flowsheet Row Office Visit from 12/20/2022 in [facility] Office Visit from 03/22/2022 in [facility] Office Visit from 05/06/2021 in [facility]  AIMS Total Score 0 0 0      GAD-7    Flowsheet Row Office Visit from 12/20/2022 in [facility] Office Visit from 03/22/2022 in [facility] Office Visit from 12/30/2021 in [facility] Office Visit from 05/06/2021 in [facility] Clinical Support from 12/30/2020 in [facility]  Total GAD-7 Score 11 0 0 0 18      PHQ2-9    Flowsheet Row Office Visit from 12/20/2022 in [facility] Office Visit from 03/22/2022 in [facility] Office Visit from 12/30/2021 in [facility] Office Visit from 10/05/2021 in  [facility] Office Visit from 05/06/2021 in [facility]  PHQ-2 Total Score 4 0 0 0 0  PHQ-9 Total Score 17 -- -- -- --      Flowsheet Row ED from 04/30/2022 in [facility] Visit from 03/22/2022 in [facility] Office Visit from 12/30/2021 in [facility]  C-SSRS RISK CATEGORY No Risk No Risk No Risk        Assessment and Plan: Patient endorses symptoms of anxiety, depression, and marijuana use. Patient informed writer that his mood is more stable when on both Seroquel and Abilify.  He reports that Abilify wore off prior to him coming in.  Patient's mother also reports that he was less irritable and depressed.  Today patient agreeable to restarting Seroquel 50 mg to help manage mood.  He will continue all other medications as prescribed.  Patient feels not restarted.  1. Schizophrenia, unspecified type (HCC)  Continue- ARIPiprazole ER (ABILIFY MAINTENA) 400 MG PRSY prefilled syringe; INJECT 400MG  INTRAMUSCULARLY EVERY 28-30 DAYS AS DIRECTED (ADMINISTER WITHIN 30 MINUTES OF RECONSTITUTION)  Dispense: 1 each; Refill: 2 - traZODone (DESYREL) 50 MG tablet; Take 1 tablet (50 mg total) by mouth at bedtime and may repeat dose one time if needed.  Dispense: 60 tablet; Refill: 3 Restart- QUEtiapine (SEROQUEL) 50 MG tablet; Take 1 tablet (50 mg total) by mouth at bedtime.  Dispense: 30 tablet; Refill: 3  2. Generalized anxiety disorder  Continue- hydrOXYzine (ATARAX) 50 MG tablet; Take 0.5 tablets (25 mg total) by mouth 3 (three) times daily as needed for anxiety.  Dispense: 90 tablet; Refill: 3 Restart- QUEtiapine (SEROQUEL) 50 MG tablet; Take 1 tablet (50 mg total) by mouth at bedtime.  Dispense: 30 tablet; Refill: 3  3. Insomnia, unspecified type  Continue- traZODone (DESYREL) 50 MG tablet; Take 1 tablet (50 mg total) by mouth at bedtime and may repeat  dose one time if needed.  Dispense: 60 tablet; Refill: 3 Restart- QUEtiapine (SEROQUEL) 50 MG tablet; Take 1 tablet (50 mg total) by mouth at bedtime.  Dispense: 30 tablet; Refill: 3  4. Marijuana abuse, continuous         Follow up in 3 months Follow up in one month  with shot clinic   Shanna Cisco, NP 12/20/2022, 4:57 PM

## 2023-01-17 ENCOUNTER — Ambulatory Visit (HOSPITAL_COMMUNITY): Payer: No Typology Code available for payment source

## 2023-01-18 ENCOUNTER — Encounter (HOSPITAL_COMMUNITY): Payer: Self-pay

## 2023-01-18 ENCOUNTER — Ambulatory Visit (INDEPENDENT_AMBULATORY_CARE_PROVIDER_SITE_OTHER): Payer: No Typology Code available for payment source

## 2023-01-18 VITALS — BP 159/111 | HR 131 | Ht 72.0 in | Wt 178.2 lb

## 2023-01-18 DIAGNOSIS — F411 Generalized anxiety disorder: Secondary | ICD-10-CM | POA: Diagnosis not present

## 2023-01-18 DIAGNOSIS — F200 Paranoid schizophrenia: Secondary | ICD-10-CM

## 2023-01-18 DIAGNOSIS — G4700 Insomnia, unspecified: Secondary | ICD-10-CM

## 2023-01-18 MED ORDER — ARIPIPRAZOLE ER 400 MG IM SRER
400.0000 mg | Freq: Once | INTRAMUSCULAR | Status: AC
  Administered 2023-01-18: 400 mg via INTRAMUSCULAR

## 2023-01-18 NOTE — Progress Notes (Cosign Needed)
Patient presents to the office for Abilify Maintena 400 Mg injection in left Deltoid. Patient seems to doing well and will return in 28 days.   

## 2023-01-19 ENCOUNTER — Ambulatory Visit (HOSPITAL_COMMUNITY): Payer: No Typology Code available for payment source

## 2023-02-14 ENCOUNTER — Encounter (HOSPITAL_COMMUNITY): Payer: Self-pay

## 2023-02-14 ENCOUNTER — Ambulatory Visit (INDEPENDENT_AMBULATORY_CARE_PROVIDER_SITE_OTHER): Payer: MEDICAID | Admitting: Psychiatry

## 2023-02-14 ENCOUNTER — Ambulatory Visit (HOSPITAL_COMMUNITY): Payer: MEDICAID

## 2023-02-14 VITALS — BP 130/82 | HR 82 | Ht 72.0 in | Wt 179.8 lb

## 2023-02-14 DIAGNOSIS — F411 Generalized anxiety disorder: Secondary | ICD-10-CM

## 2023-02-14 DIAGNOSIS — G4700 Insomnia, unspecified: Secondary | ICD-10-CM | POA: Diagnosis not present

## 2023-02-14 DIAGNOSIS — F209 Schizophrenia, unspecified: Secondary | ICD-10-CM | POA: Diagnosis not present

## 2023-02-14 DIAGNOSIS — F200 Paranoid schizophrenia: Secondary | ICD-10-CM

## 2023-02-14 MED ORDER — HYDROXYZINE HCL 50 MG PO TABS
25.0000 mg | ORAL_TABLET | Freq: Three times a day (TID) | ORAL | 3 refills | Status: AC | PRN
Start: 2023-02-14 — End: ?

## 2023-02-14 MED ORDER — ARIPIPRAZOLE ER 400 MG IM PRSY
400.0000 mg | PREFILLED_SYRINGE | Freq: Once | INTRAMUSCULAR | Status: AC
  Administered 2023-02-14: 400 mg via INTRAMUSCULAR

## 2023-02-14 MED ORDER — ABILIFY MAINTENA 400 MG IM PRSY: PREFILLED_SYRINGE | INTRAMUSCULAR | 2 refills | Status: DC

## 2023-02-14 MED ORDER — TRAZODONE HCL 50 MG PO TABS
50.0000 mg | ORAL_TABLET | Freq: Every evening | ORAL | 3 refills | Status: AC | PRN
Start: 2023-02-14 — End: ?

## 2023-02-14 NOTE — Progress Notes (Signed)
BH MD/PA/NP OP Progress Note  02/14/2023 5:02 PM Andres Mccall  MRN:  [id number]  Chief Complaint: "I am doing okay."  HPI: 27 year old male seen today for follow up psychiatric evaluation.   He has a psychiatric history of depression, anxiety, marijuana use, and schizophrenia.  He is currently managed on Abilify 400 mg monthly, hydroxyzine 50 mg daily as needed, Seroquel 50 mg nightly, and trazodone 50 mg nightly as needed.  He has not started Seroquel and feels mentally stable.   Today he is well-groomed, pleasant, cooperative, and engaged in conversation.  He informed Clinical research associate that he is doing okay. He notes that he did not start Seroquel because he could not afford it and feels mentally stable. He also notes that he has reduced his marijuana consumption to 3 times daily instead of 5 times daily. He notes that he fears going to jail for a 3 pending charges and having to go without marijuana. Patient notes that 7 of his charges were dropped and is hopeful that the remaining three will be dropped in August.   Patient notes his mood is stable and notes that his anxiety and depression is well managed.   He continues to worry about his upcoming court cases and his children.   Today provider conducted GAD-7 and patient scored an 12, at his last visit he scored an 21.  Provider also conducted PHQ-9 and patient scored a 11, at his last visit he scored a 17.  He notes that his adequate and sleep are adequate sleep.  Today denies SI/HI/VAH or paranoia.   Today provider conducted an aims assessment and patient scored a 0.  Patient allowed writer to speak to his mom on the phone.  She informed Clinical research associate that he is doing good but is wanting to find a job.  Provider discussed with patient and his mother Andres Mccall to help with substance use is well as employment.  At this time Seroquel not restarted.  Patient will continue all other medications as prescribed.  No other concerns noted at this time.   Visit  Diagnosis:    ICD-10-CM   1. Schizophrenia, unspecified type (HCC)  F20.9 traZODone (DESYREL) 50 MG tablet    ARIPiprazole ER (ABILIFY MAINTENA) 400 MG PRSY prefilled syringe    2. Insomnia, unspecified type  G47.00 traZODone (DESYREL) 50 MG tablet    3. Generalized anxiety disorder  F41.1 hydrOXYzine (ATARAX) 50 MG tablet        Past Psychiatric History:  Depression, anxiety, marijuana use, and schizophrenia  Depression, anxiety, marijuana use, and schizophrenia  Depression, anxiety, marijuana use, and schizophrenia   Past Medical History:  Past Medical History:  Diagnosis Date   Bipolar affective (HCC)    Schizophrenia (HCC)    History reviewed. No pertinent surgical history.  Family Psychiatric History:  Denies   Family History: History reviewed. No pertinent family history.  Social History:  Social History   Socioeconomic History   Marital status: Single    Spouse name: Not on file   Number of children: Not on file   Years of education: Not on file   Highest education level: Not on file  Occupational History   Not on file  Tobacco Use   Smoking status: Every Day    Types: Cigarettes   Smokeless tobacco: Never  Substance and Sexual Activity   Alcohol use: Yes   Drug use: Yes    Types: Marijuana    Comment: Delta 8   Sexual activity: Not on file  Other Topics Concern   Not on file  Social History Narrative   Not on file   Social Determinants of Health   Financial Resource Strain: Not on file  Food Insecurity: Not on file  Transportation Needs: Not on file  Physical Activity: Not on file  Stress: Not on file  Social Connections: Not on file    Allergies:  Allergies  Allergen Reactions   Gentamicin Hives    Metabolic Disorder Labs: Lab Results  Component Value Date   HGBA1C 5.3 12/29/2020   MPG 105 12/29/2020   No results found for: "PROLACTIN" Lab Results  Component Value Date   CHOL 138 12/29/2020   TRIG 47 12/29/2020   HDL 52  12/29/2020   CHOLHDL 2.7 12/29/2020   VLDL 9 12/29/2020   LDLCALC 77 12/29/2020   Lab Results  Component Value Date   TSH 2.574 12/29/2020    Therapeutic Level Labs: No results found for: "LITHIUM" No results found for: "VALPROATE" No results found for: "CBMZ"  Current Medications: Current Outpatient Medications  Medication Sig Dispense Refill   ARIPiprazole ER (ABILIFY MAINTENA) 400 MG PRSY prefilled syringe INJECT 400MG  INTRAMUSCULARLY EVERY 28-30 DAYS AS DIRECTED (ADMINISTER WITHIN 30 MINUTES OF RECONSTITUTION) 1 each 2   doxycycline (VIBRAMYCIN) 100 MG capsule Take 1 capsule (100 mg total) by mouth 2 (two) times daily. 14 capsule 0   hydrOXYzine (ATARAX) 50 MG tablet Take 0.5 tablets (25 mg total) by mouth 3 (three) times daily as needed for anxiety. 90 tablet 3   permethrin (ELIMITE) 5 % cream Apply from neck down before bed then wash off in the morning. May repeat in one week. 60 g 1   traZODone (DESYREL) 50 MG tablet Take 1 tablet (50 mg total) by mouth at bedtime and may repeat dose one time if needed. 60 tablet 3   No current facility-administered medications for this visit.     Musculoskeletal: Strength & Muscle Tone: within normal limits Gait & Station: normal Patient leans: N/A  Psychiatric Specialty Exam: Review of Systems  There were no vitals taken for this visit.There is no height or weight on file to calculate BMI.  General Appearance: Well Groomed  Eye Contact:  Good  Speech:  Clear and Coherent and Slow  Volume:  Normal  Mood:  Anxious and Depressed  Affect:  Appropriate and Congruent  Thought Process:  Coherent, Goal Directed, and Linear  Orientation:  Full (Time, Place, and Person)  Thought Content: WDL   Suicidal Thoughts:  No  Homicidal Thoughts:  No  Memory:  Immediate;   Good Recent;   Good Remote;   Good  Judgement:  Good  Insight:  Good  Psychomotor Activity:  Normal  Concentration:  Concentration: Good and Attention Span: Good  Recall:   Good  Fund of Knowledge: Good  Language: Good  Akathisia:  No  Handed:  Right  AIMS (if indicated): Not Done   Assets:  Communication Skills Desire for Improvement Financial Resources/Insurance Housing Leisure Time Physical Health Social Support  ADL's:  Intact  Cognition: WNL  Sleep:  Good   Screenings: AIMS    Flowsheet Row Office Visit from 02/14/2023 in [facility] Office Visit from 12/20/2022 in [facility] Office Visit from 03/22/2022 in [facility] Office Visit from 05/06/2021 in [facility]  AIMS Total Score 0 0 0 0      GAD-7    Flowsheet Row Office Visit from 02/14/2023  in [facility] Office Visit from 12/20/2022 in [facility] Office Visit from 03/22/2022 in [facility] Office Visit from 12/30/2021 in [facility] Office Visit from 05/06/2021 in [facility]  Total GAD-7 Score 12 11 0 0 0      PHQ2-9    Flowsheet Row Office Visit from 02/14/2023 in [facility] Office Visit from 12/20/2022 in [facility] Office Visit from 03/22/2022 in [facility] Office Visit from 12/30/2021 in [facility] Office Visit from 10/05/2021 in [facility]  PHQ-2 Total Score 3 4 0 0 0  PHQ-9 Total Score 11 17 -- -- --      Flowsheet Row ED from 04/30/2022 in [facility] Visit from 03/22/2022 in [facility] Office Visit from 12/30/2021 in [facility]  C-SSRS RISK CATEGORY No Risk No Risk No Risk        Assessment and Plan: Patient endorses situational anxiety and depression due to his pending court cases.  He does  note that he has reduced his marijuana consumption and feels mentally stable.  He has not started Seroquel and at this time Seroquel not restarted.  He will continue all other medication as prescribed.  Patient and mother notes that he is in need of a job.  Provider gave patient resources to [facility] for substance use as well as employment.  1. Schizophrenia, unspecified type (HCC)  Continue- traZODone (DESYREL) 50 MG tablet; Take 1 tablet (50 mg total) by mouth at bedtime and may repeat dose one time if needed.  Dispense: 60 tablet; Refill: 3 Continue- ARIPiprazole ER (ABILIFY MAINTENA) 400 MG PRSY prefilled syringe; INJECT 400MG  INTRAMUSCULARLY EVERY 28-30 DAYS AS DIRECTED (ADMINISTER WITHIN 30 MINUTES OF RECONSTITUTION)  Dispense: 1 each; Refill: 2  2. Insomnia, unspecified type  Continue- traZODone (DESYREL) 50 MG tablet; Take 1 tablet (50 mg total) by mouth at bedtime and may repeat dose one time if needed.  Dispense: 60 tablet; Refill: 3  3. Generalized anxiety disorder  Continue- hydrOXYzine (ATARAX) 50 MG tablet; Take 0.5 tablets (25 mg total) by mouth 3 (three) times daily as needed for anxiety.  Dispense: 90 tablet; Refill: 3         Follow up in 3 months Follow up in one month with shot clinic   Shanna Cisco, NP 02/14/2023, 5:02 PM

## 2023-02-14 NOTE — Progress Notes (Cosign Needed)
Patient presents to the office for Abilify Maintena 400 Mg injection in RIGHT Deltoid. Patient seems to doing well and will return in 28 days. Pt seems to be doing well

## 2023-02-16 ENCOUNTER — Ambulatory Visit (HOSPITAL_COMMUNITY): Payer: MEDICAID

## 2023-03-14 ENCOUNTER — Ambulatory Visit (INDEPENDENT_AMBULATORY_CARE_PROVIDER_SITE_OTHER): Payer: MEDICAID

## 2023-03-14 ENCOUNTER — Encounter (HOSPITAL_COMMUNITY): Payer: Self-pay

## 2023-03-14 VITALS — BP 162/92 | HR 106 | Ht 72.0 in | Wt 177.6 lb

## 2023-03-14 DIAGNOSIS — G4700 Insomnia, unspecified: Secondary | ICD-10-CM

## 2023-03-14 DIAGNOSIS — F200 Paranoid schizophrenia: Secondary | ICD-10-CM

## 2023-03-14 DIAGNOSIS — F411 Generalized anxiety disorder: Secondary | ICD-10-CM

## 2023-03-14 MED ORDER — ARIPIPRAZOLE ER 400 MG IM PRSY
400.0000 mg | PREFILLED_SYRINGE | Freq: Once | INTRAMUSCULAR | Status: AC
  Administered 2023-03-14: 400 mg via INTRAMUSCULAR

## 2023-03-14 NOTE — Progress Notes (Cosign Needed)
Patient presents to the office for Abilify Maintena 400 Mg injection in left Deltoid. Patient seems to doing well and will return in 28 days.   

## 2023-04-13 ENCOUNTER — Ambulatory Visit (INDEPENDENT_AMBULATORY_CARE_PROVIDER_SITE_OTHER): Payer: MEDICAID

## 2023-04-13 ENCOUNTER — Other Ambulatory Visit (HOSPITAL_COMMUNITY): Payer: Self-pay | Admitting: Psychiatry

## 2023-04-13 ENCOUNTER — Other Ambulatory Visit: Payer: Self-pay

## 2023-04-13 ENCOUNTER — Encounter (HOSPITAL_COMMUNITY): Payer: Self-pay

## 2023-04-13 VITALS — BP 138/89 | HR 107 | Ht 72.0 in | Wt 180.4 lb

## 2023-04-13 DIAGNOSIS — F411 Generalized anxiety disorder: Secondary | ICD-10-CM

## 2023-04-13 DIAGNOSIS — G4700 Insomnia, unspecified: Secondary | ICD-10-CM

## 2023-04-13 DIAGNOSIS — F200 Paranoid schizophrenia: Secondary | ICD-10-CM

## 2023-04-13 DIAGNOSIS — F209 Schizophrenia, unspecified: Secondary | ICD-10-CM

## 2023-04-13 MED ORDER — ABILIFY MAINTENA 400 MG IM PRSY: PREFILLED_SYRINGE | INTRAMUSCULAR | 11 refills | Status: DC

## 2023-04-13 MED ORDER — ARIPIPRAZOLE ER 400 MG IM PRSY
400.0000 mg | PREFILLED_SYRINGE | Freq: Once | INTRAMUSCULAR | Status: AC
  Administered 2023-04-13: 400 mg via INTRAMUSCULAR

## 2023-04-13 NOTE — Progress Notes (Cosign Needed)
Patient presents to the office for Abilify Maintena 400 Mg injection in RIGHT Deltoid. Patient seems to doing well and will return in 28 days. Pt seems to be doing well

## 2023-04-13 NOTE — Telephone Encounter (Signed)
Medication was not delivered to the clinic.  Patient was given a sample of Abilify Maintena 400 mg today.  Medication sent to [facility] pharmacy with request to have it delivered to [facility].

## 2023-05-09 ENCOUNTER — Ambulatory Visit (INDEPENDENT_AMBULATORY_CARE_PROVIDER_SITE_OTHER): Payer: MEDICAID | Admitting: *Deleted

## 2023-05-09 ENCOUNTER — Encounter (HOSPITAL_COMMUNITY): Payer: Self-pay

## 2023-05-09 ENCOUNTER — Ambulatory Visit (INDEPENDENT_AMBULATORY_CARE_PROVIDER_SITE_OTHER): Payer: MEDICAID | Admitting: Psychiatry

## 2023-05-09 VITALS — BP 158/95 | HR 101 | Resp 16 | Ht 72.0 in | Wt 181.8 lb

## 2023-05-09 DIAGNOSIS — F200 Paranoid schizophrenia: Secondary | ICD-10-CM | POA: Diagnosis not present

## 2023-05-09 DIAGNOSIS — F209 Schizophrenia, unspecified: Secondary | ICD-10-CM

## 2023-05-09 DIAGNOSIS — F1290 Cannabis use, unspecified, uncomplicated: Secondary | ICD-10-CM

## 2023-05-09 MED ORDER — ABILIFY MAINTENA 400 MG IM PRSY: PREFILLED_SYRINGE | INTRAMUSCULAR | 11 refills | Status: DC

## 2023-05-09 MED ORDER — ARIPIPRAZOLE ER 400 MG IM PRSY
400.0000 mg | PREFILLED_SYRINGE | Freq: Once | INTRAMUSCULAR | Status: AC
Start: 2023-05-09 — End: 2023-05-09
  Administered 2023-05-09: 400 mg via INTRAMUSCULAR

## 2023-05-09 NOTE — Progress Notes (Signed)
BH MD/PA/NP OP Progress Note  05/09/2023 9:44 AM Andres Mccall  MRN:  [id number]  Chief Complaint: " My court date with continued."  HPI: 27 year old male seen today for follow up psychiatric evaluation.   He has a psychiatric history of depression, anxiety, marijuana use, and schizophrenia.  He is currently managed on Abilify 400 mg monthly, hydroxyzine 50 mg daily as needed, and trazodone 50 mg nightly as needed.  He informed Clinical research associate when taking Abilify and notes that it is effective in managing his psychiatric condition.   Today he is well-groomed, pleasant, cooperative, and engaged in conversation.  He informed Clinical research associate that his court case was continues to November.  He notes that this causes him some anxiety as he is praying that he will be on probation.  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  Since his last visit he notes that his anxiety and depression continues to be well-managed.  Provider conducted a GAD-7 and patient scored a 6, at his last visit he scored 12.  Provider also conducted PHQ-9 and patient 3, at his last visit he scored an 50.  He endorses adequate sleep and appetite.  Today denies SI/HI/VAH.   Patient notes that he has been more paranoid.  He reports that he has started smoking marijuana daily.  Provider informed patient that marijuana can exacerbate his mental health.  He endorsed understanding and notes that he will try to cut down his consumption.  No medication changes made today.  Patient agreeable to continue medication as prescribed.  No other concerns noted at this time.   Visit Diagnosis:    ICD-10-CM   1. Schizophrenia, unspecified type (HCC)  F20.9 ARIPiprazole ER (ABILIFY MAINTENA) 400 MG PRSY prefilled syringe    2. Marijuana use  F12.90          Past Psychiatric History:  Depression, anxiety, marijuana use, and schizophrenia  Depression, anxiety,  marijuana use, and schizophrenia  Depression, anxiety, marijuana use, and schizophrenia   Past Medical History:  Past Medical History:  Diagnosis Date   Bipolar affective (HCC)    Schizophrenia (HCC)    History reviewed. No pertinent surgical history.  Family Psychiatric History:  Denies   Family History: History reviewed. No pertinent family history.  Social History:  Social History   Socioeconomic History   Marital status: Single    Spouse name: Not on file   Number of children: Not on file   Years of education: Not on file   Highest education level: Not on file  Occupational History   Not on file  Tobacco Use   Smoking status: Every Day    Types: Cigarettes   Smokeless tobacco: Never  Substance and Sexual Activity   Alcohol use: Yes   Drug use: Yes    Types: Marijuana    Comment: Delta 8   Sexual activity: Not on file  Other Topics Concern   Not on file  Social History Narrative   Not on file   Social Determinants of Health   Financial Resource Strain: Not on file  Food Insecurity: Not on file  Transportation Needs: Not on file  Physical Activity: Not on file  Stress: Not on file  Social Connections: Not on file    Allergies:  Allergies  Allergen Reactions   Gentamicin Hives    Metabolic Disorder Labs: Lab Results  Component Value Date  HGBA1C 5.3 12/29/2020   MPG 105 12/29/2020   No results found for: "PROLACTIN" Lab Results  Component Value Date   CHOL 138 12/29/2020   TRIG 47 12/29/2020   HDL 52 12/29/2020   CHOLHDL 2.7 12/29/2020   VLDL 9 12/29/2020   LDLCALC 77 12/29/2020   Lab Results  Component Value Date   TSH 2.574 12/29/2020    Therapeutic Level Labs: No results found for: "LITHIUM" No results found for: "VALPROATE" No results found for: "CBMZ"  Current Medications: Current Outpatient Medications  Medication Sig Dispense Refill   ARIPiprazole ER (ABILIFY MAINTENA) 400 MG PRSY prefilled syringe INJECT 400MG   INTRAMUSCULARLY EVERY 28-30 DAYS AS DIRECTED (ADMINISTER WITHIN 30 MINUTES OF RECONSTITUTION) 400 each 11   doxycycline (VIBRAMYCIN) 100 MG capsule Take 1 capsule (100 mg total) by mouth 2 (two) times daily. 14 capsule 0   permethrin (ELIMITE) 5 % cream Apply from neck down before bed then wash off in the morning. May repeat in one week. 60 g 1   No current facility-administered medications for this visit.     Musculoskeletal: Strength & Muscle Tone: within normal limits Gait & Station: normal Patient leans: N/A  Psychiatric Specialty Exam: Review of Systems  There were no vitals taken for this visit.There is no height or weight on file to calculate BMI.  General Appearance: Well Groomed  Eye Contact:  Good  Speech:  Clear and Coherent and Slow  Volume:  Normal  Mood:  Anxious and Depressed  Affect:  Appropriate and Congruent  Thought Process:  Coherent, Goal Directed, and Linear  Orientation:  Full (Time, Place, and Person)  Thought Content: WDL   Suicidal Thoughts:  No  Homicidal Thoughts:  No  Memory:  Immediate;   Good Recent;   Good Remote;   Good  Judgement:  Good  Insight:  Good  Psychomotor Activity:  Normal  Concentration:  Concentration: Good and Attention Span: Good  Recall:  Good  Fund of Knowledge: Good  Language: Good  Akathisia:  No  Handed:  Right  AIMS (if indicated): Not Done   Assets:  Communication Skills Desire for Improvement Financial Resources/Insurance Housing Leisure Time Physical Health Social Support  ADL's:  Intact  Cognition: WNL  Sleep:  Good   Screenings: AIMS    Flowsheet Row Office Visit from 02/14/2023 in [facility] Office Visit from 12/20/2022 in [facility] Office Visit from 03/22/2022 in [facility] Office Visit from 05/06/2021 in [facility]  AIMS Total Score 0 0 0 0      GAD-7    Flowsheet Row Office Visit  from 02/14/2023 in [facility] Office Visit from 12/20/2022 in [facility] Office Visit from 03/22/2022 in [facility] Office Visit from 12/30/2021 in [facility] Office Visit from 05/06/2021 in [facility]  Total GAD-7 Score 12 11 0 0 0      PHQ2-9    Flowsheet Row Office Visit from 02/14/2023 in [facility] Office Visit from 12/20/2022 in [facility] Office Visit from 03/22/2022 in [facility] Office Visit from 12/30/2021 in [facility] Office Visit from 10/05/2021 in [facility]  PHQ-2 Total Score 3 4 0 0 0  PHQ-9 Total Score 11 17 -- -- --      [employer]  ED from 04/30/2022 in [facility] Visit from 03/22/2022 in [facility] Office Visit from 12/30/2021 in [facility]  C-SSRS RISK CATEGORY No Risk No Risk No Risk        Assessment and Plan: Patient endorses increased marijuana use and paranoia. He however notes that his mood is stable and denies VAH or mania. No medication changes made today.  Patient agreeable to continue medication as prescribed.  Provider recommended patient discontinuing his marijuana use.  He notes that he will try to cut back.  1. Schizophrenia, unspecified type (HCC)  Continue- ARIPiprazole ER (ABILIFY MAINTENA) 400 MG PRSY prefilled syringe; INJECT 400MG  INTRAMUSCULARLY EVERY 28-30 DAYS AS DIRECTED (ADMINISTER WITHIN 30 MINUTES OF RECONSTITUTION)  Dispense: 400 each; Refill: 11  2. Marijuana use        Follow up in 3 months Follow up in one month with shot clinic   Shanna Cisco, NP 05/09/2023, 9:44 AM

## 2023-05-09 NOTE — Progress Notes (Cosign Needed)
Patient arrived for his injection of Abilify Maintena 400mg . Given in Left Deltoid without issues or complaints. Denies any side effects, states everything is going well. Smiling and appropriate. Will return in 28 days.

## 2023-06-06 ENCOUNTER — Ambulatory Visit (INDEPENDENT_AMBULATORY_CARE_PROVIDER_SITE_OTHER): Payer: MEDICAID | Admitting: *Deleted

## 2023-06-06 ENCOUNTER — Telehealth (HOSPITAL_COMMUNITY): Payer: Self-pay | Admitting: Psychiatry

## 2023-06-06 ENCOUNTER — Encounter (HOSPITAL_COMMUNITY): Payer: Self-pay

## 2023-06-06 VITALS — BP 162/83 | HR 74 | Resp 16 | Ht 72.0 in | Wt 187.6 lb

## 2023-06-06 DIAGNOSIS — F209 Schizophrenia, unspecified: Secondary | ICD-10-CM | POA: Diagnosis not present

## 2023-06-06 DIAGNOSIS — R030 Elevated blood-pressure reading, without diagnosis of hypertension: Secondary | ICD-10-CM

## 2023-06-06 MED ORDER — ARIPIPRAZOLE ER 400 MG IM PRSY
400.0000 mg | PREFILLED_SYRINGE | Freq: Once | INTRAMUSCULAR | Status: AC
  Administered 2023-06-06: 400 mg via INTRAMUSCULAR

## 2023-06-06 NOTE — Progress Notes (Cosign Needed)
Patient arrived for injection of Abilify 400mg . Given in Right Deltoid without issue or complaint. BP was elevated at 162/102, Dr. Doyne Keel spoke with patient about visiting a PCP and getting an EKG. Patient states he will follow up. Pleasant, cooperative, denies SI/HI or AV hallucinations. No side effects from medication. Will return in 28 days.

## 2023-06-06 NOTE — Telephone Encounter (Signed)
Patient present to the clinic for his LAI. Patients blood pressure elevated today at 162/83. At his last visit his blood pressure was also elevated at 168/95.  [facility].  Provider informed patient that an EKG will be ordered at his next visit.

## 2023-07-05 ENCOUNTER — Ambulatory Visit (HOSPITAL_COMMUNITY): Payer: MEDICAID

## 2023-07-05 ENCOUNTER — Encounter (HOSPITAL_COMMUNITY): Payer: Self-pay

## 2023-07-05 VITALS — BP 125/79 | HR 85 | Wt 183.2 lb

## 2023-07-05 DIAGNOSIS — F200 Paranoid schizophrenia: Secondary | ICD-10-CM

## 2023-07-05 DIAGNOSIS — G4700 Insomnia, unspecified: Secondary | ICD-10-CM

## 2023-07-05 DIAGNOSIS — F411 Generalized anxiety disorder: Secondary | ICD-10-CM

## 2023-07-05 MED ORDER — ARIPIPRAZOLE ER 400 MG IM PRSY
400.0000 mg | PREFILLED_SYRINGE | Freq: Once | INTRAMUSCULAR | Status: AC
  Administered 2023-07-05: 400 mg via INTRAMUSCULAR

## 2023-07-05 NOTE — Progress Notes (Cosign Needed)
Patient presents to the office for Abilify Maintena 400 Mg injection in left Deltoid. Patient seems to doing well and will return in 28 days.

## 2023-07-31 ENCOUNTER — Ambulatory Visit (HOSPITAL_COMMUNITY): Payer: MEDICAID

## 2023-08-03 ENCOUNTER — Encounter (HOSPITAL_COMMUNITY): Payer: Self-pay

## 2023-08-03 ENCOUNTER — Ambulatory Visit (HOSPITAL_COMMUNITY): Payer: MEDICAID | Admitting: *Deleted

## 2023-08-03 ENCOUNTER — Ambulatory Visit (HOSPITAL_COMMUNITY): Payer: MEDICAID

## 2023-08-03 VITALS — BP 125/80 | HR 68 | Resp 16 | Ht 72.0 in | Wt 186.8 lb

## 2023-08-03 DIAGNOSIS — F1290 Cannabis use, unspecified, uncomplicated: Secondary | ICD-10-CM

## 2023-08-03 DIAGNOSIS — F200 Paranoid schizophrenia: Secondary | ICD-10-CM

## 2023-08-03 DIAGNOSIS — F209 Schizophrenia, unspecified: Secondary | ICD-10-CM

## 2023-08-03 MED ORDER — ABILIFY MAINTENA 400 MG IM PRSY: PREFILLED_SYRINGE | INTRAMUSCULAR | 11 refills | Status: DC

## 2023-08-03 MED ORDER — ARIPIPRAZOLE ER 400 MG IM PRSY
400.0000 mg | PREFILLED_SYRINGE | Freq: Once | INTRAMUSCULAR | Status: AC
  Administered 2023-08-03: 400 mg via INTRAMUSCULAR

## 2023-08-03 NOTE — Progress Notes (Cosign Needed)
Patient arrived for his injection of Abilify 400mg . Denies any issues with side effects. Pleasant, cooperative, smiling and talking about what his kids received for Christmas. Spoke with MD for his follow up appointment. Injection given in Right Deltoid without issue or complaint. No SI/HI or AV hallucinations. Will return in 28 days.

## 2023-08-03 NOTE — Progress Notes (Signed)
BH MD/PA/NP OP Progress Note  08/03/2023 11:01 AM Andres Mccall  MRN:  [id number]  Chief Complaint: "Everything is good."  HPI: 27 year old male seen today for follow up psychiatric evaluation.   He has a psychiatric history of depression, anxiety, marijuana use, and schizophrenia.  He is currently managed on Abilify 400 mg monthly.  He informed Clinical research associate that Abilify is effective in managing his psychiatric condition.   Today he is well-groomed, pleasant, cooperative, and engaged in conversation.  He informed Clinical research associate that everything is good.  He notes that he enjoyed the Christmas holiday with his children.  Patient also informed Clinical research associate that recently he started a new job at [employer].  He reports that his court case has been continued until the new year. Patient notes that he is thankful that he has not been placed in jail.  Patient continues to note that his anxiety and depression are well-managed.  Today provider conducted a GAD-7 and patient scored an 6, at his last visit he scored a 6.  Provider also conducted a PHQ-9 the patient scored a 3, at his last visit he scored a 3.   He endorses adequate sleep and appetite.  Today denies SI/HI/VAH.   Patient informed Clinical research associate that he continues to smoke marijuana daily.  Provider informed patient that marijuana can exacerbate his mental health.  He endorsed understanding.  No medication changes made today.  Patient agreeable to continue medication as prescribed.  No other concerns noted at this time.   Visit Diagnosis:    ICD-10-CM   1. Marijuana use  F12.90     2. Schizophrenia, unspecified type (HCC)  F20.9 ARIPiprazole ER (ABILIFY MAINTENA) 400 MG PRSY prefilled syringe          Past Psychiatric History:  Depression, anxiety, marijuana use, and schizophrenia  Depression, anxiety, marijuana use, and schizophrenia  Depression, anxiety, marijuana use, and schizophrenia   Past Medical History:  Past Medical History:  Diagnosis Date   Bipolar  affective (HCC)    Schizophrenia (HCC)    No past surgical history on file.  Family Psychiatric History:  Denies   Family History: No family history on file.  Social History:  Social History   Socioeconomic History   Marital status: Single    Spouse name: Not on file   Number of children: Not on file   Years of education: Not on file   Highest education level: Not on file  Occupational History   Not on file  Tobacco Use   Smoking status: Every Day    Types: Cigarettes   Smokeless tobacco: Never  Substance and Sexual Activity   Alcohol use: Yes   Drug use: Yes    Types: Marijuana    Comment: Delta 8   Sexual activity: Not on file  Other Topics Concern   Not on file  Social History Narrative   Not on file   Social Drivers of Health   Financial Resource Strain: Not on file  Food Insecurity: Not on file  Transportation Needs: Not on file  Physical Activity: Not on file  Stress: Not on file  Social Connections: Not on file    Allergies:  Allergies  Allergen Reactions   Gentamicin Hives    Metabolic Disorder Labs: Lab Results  Component Value Date   HGBA1C 5.3 12/29/2020   MPG 105 12/29/2020   No results found for: "PROLACTIN" Lab Results  Component Value Date   CHOL 138 12/29/2020   TRIG 47 12/29/2020   HDL 52  12/29/2020   CHOLHDL 2.7 12/29/2020   VLDL 9 12/29/2020   LDLCALC 77 12/29/2020   Lab Results  Component Value Date   TSH 2.574 12/29/2020    Therapeutic Level Labs: No results found for: "LITHIUM" No results found for: "VALPROATE" No results found for: "CBMZ"  Current Medications: Current Outpatient Medications  Medication Sig Dispense Refill   ARIPiprazole ER (ABILIFY MAINTENA) 400 MG PRSY prefilled syringe INJECT 400MG  INTRAMUSCULARLY EVERY 28-30 DAYS AS DIRECTED (ADMINISTER WITHIN 30 MINUTES OF RECONSTITUTION) 400 each 11   doxycycline (VIBRAMYCIN) 100 MG capsule Take 1 capsule (100 mg total) by mouth 2 (two) times daily. 14 capsule  0   permethrin (ELIMITE) 5 % cream Apply from neck down before bed then wash off in the morning. May repeat in one week. 60 g 1   No current facility-administered medications for this visit.     Musculoskeletal: Strength & Muscle Tone: within normal limits Gait & Station: normal Patient leans: N/A  Psychiatric Specialty Exam: Review of Systems  There were no vitals taken for this visit.There is no height or weight on file to calculate BMI.  General Appearance: Well Groomed  Eye Contact:  Good  Speech:  Clear and Coherent and Slow  Volume:  Normal  Mood:  Anxious and Depressed  Affect:  Appropriate and Congruent  Thought Process:  Coherent, Goal Directed, and Linear  Orientation:  Full (Time, Place, and Person)  Thought Content: WDL   Suicidal Thoughts:  No  Homicidal Thoughts:  No  Memory:  Immediate;   Good Recent;   Good Remote;   Good  Judgement:  Good  Insight:  Good  Psychomotor Activity:  Normal  Concentration:  Concentration: Good and Attention Span: Good  Recall:  Good  Fund of Knowledge: Good  Language: Good  Akathisia:  No  Handed:  Right  AIMS (if indicated): Not Done   Assets:  Communication Skills Desire for Improvement Financial Resources/Insurance Housing Leisure Time Physical Health Social Support  ADL's:  Intact  Cognition: WNL  Sleep:  Good   Screenings: AIMS    Flowsheet Row Office Visit from 02/14/2023 in [facility] Office Visit from 12/20/2022 in [facility] Office Visit from 03/22/2022 in [facility] Office Visit from 05/06/2021 in [facility]  AIMS Total Score 0 0 0 0      GAD-7    Flowsheet Row Office Visit from 08/03/2023 in [facility] Office Visit from 02/14/2023 in [facility] Office Visit from 12/20/2022 in [facility] Office Visit from  03/22/2022 in [facility] Office Visit from 12/30/2021 in [facility]  Total GAD-7 Score 6 12 11  0 0      PHQ2-9    Flowsheet Row Office Visit from 08/03/2023 in [facility] Office Visit from 02/14/2023 in [facility] Office Visit from 12/20/2022 in [facility] Office Visit from 03/22/2022 in [facility] Office Visit from 12/30/2021 in [facility]  PHQ-2 Total Score 2 3 4  0 0  PHQ-9 Total Score 3 11 17  -- --      Flowsheet Row ED from 04/30/2022 in [facility] Visit from 03/22/2022 in [facility] Office Visit from 12/30/2021 in [facility]  C-SSRS RISK CATEGORY No  Risk No Risk No Risk        Assessment and Plan: Patient endorses increased marijuana use but reports that his anxiety and depression continues to be well-managed. No medication changes made today.  Patient agreeable to continue medication as prescribed.  1. Schizophrenia, unspecified type (HCC)  Continue- ARIPiprazole ER (ABILIFY MAINTENA) 400 MG PRSY prefilled syringe; INJECT 400MG  INTRAMUSCULARLY EVERY 28-30 DAYS AS DIRECTED (ADMINISTER WITHIN 30 MINUTES OF RECONSTITUTION)  Dispense: 400 each; Refill: 11  2. Marijuana use        Follow up in 3 months Follow up in one month with shot clinic   Shanna Cisco, NP 08/03/2023, 11:01 AM

## 2023-08-31 ENCOUNTER — Encounter (HOSPITAL_COMMUNITY): Payer: Self-pay

## 2023-08-31 ENCOUNTER — Ambulatory Visit (HOSPITAL_COMMUNITY): Payer: MEDICAID

## 2023-08-31 VITALS — BP 169/95 | HR 116 | Wt 178.8 lb

## 2023-08-31 DIAGNOSIS — F200 Paranoid schizophrenia: Secondary | ICD-10-CM

## 2023-08-31 DIAGNOSIS — G4700 Insomnia, unspecified: Secondary | ICD-10-CM

## 2023-08-31 DIAGNOSIS — F411 Generalized anxiety disorder: Secondary | ICD-10-CM

## 2023-08-31 MED ORDER — ARIPIPRAZOLE ER 400 MG IM PRSY
400.0000 mg | PREFILLED_SYRINGE | Freq: Once | INTRAMUSCULAR | Status: AC
  Administered 2023-11-02: 400 mg via INTRAMUSCULAR

## 2023-08-31 NOTE — Progress Notes (Cosign Needed)
 Patient presents to the office for Abilify Maintena 400 Mg injection in left Deltoid. Patient seems to doing well and will return in 28 days.

## 2023-09-05 ENCOUNTER — Telehealth (HOSPITAL_COMMUNITY): Payer: Self-pay | Admitting: Psychiatry
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
## 2023-09-18 ENCOUNTER — Telehealth (HOSPITAL_COMMUNITY): Payer: Self-pay

## 2023-09-18 NOTE — Telephone Encounter (Signed)
 Did not mean to create.   JNL,cma

## 2023-09-28 ENCOUNTER — Ambulatory Visit (HOSPITAL_COMMUNITY): Payer: MEDICAID

## 2023-10-25 ENCOUNTER — Ambulatory Visit (HOSPITAL_COMMUNITY): Payer: MEDICAID

## 2023-11-02 ENCOUNTER — Encounter (HOSPITAL_COMMUNITY): Payer: Self-pay

## 2023-11-02 ENCOUNTER — Ambulatory Visit (INDEPENDENT_AMBULATORY_CARE_PROVIDER_SITE_OTHER): Payer: MEDICAID | Admitting: Psychiatry

## 2023-11-02 ENCOUNTER — Ambulatory Visit (INDEPENDENT_AMBULATORY_CARE_PROVIDER_SITE_OTHER): Payer: MEDICAID | Admitting: *Deleted

## 2023-11-02 DIAGNOSIS — F209 Schizophrenia, unspecified: Secondary | ICD-10-CM

## 2023-11-02 DIAGNOSIS — F200 Paranoid schizophrenia: Secondary | ICD-10-CM

## 2023-11-02 MED ORDER — ARIPIPRAZOLE ER 300 MG IM SRER
300.0000 mg | Freq: Once | INTRAMUSCULAR | Status: AC
  Administered 2023-11-30: 300 mg via INTRAMUSCULAR

## 2023-11-02 MED ORDER — ARIPIPRAZOLE ER 300 MG IM PRSY: 300.0000 mg | PREFILLED_SYRINGE | INTRAMUSCULAR | 11 refills | Status: DC

## 2023-11-02 NOTE — Progress Notes (Cosign Needed)
 Patient arrived for injection ABILIFY 400 MG tolerated well in Right Deltoid. Pleasant as always. Shared he will be moving soon with his  Mother  & he's excited.   NO SI/HI  NOR VH/AH

## 2023-11-02 NOTE — Patient Instructions (Signed)
 Patient arrived for injection ABILIFY 400 MG tolerated well in Right Deltoid. Pleasant as always. Shared he will be moving soon with his  Mother  & he's excited.   NO SI/HI  NOR VH/AH
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
## 2023-11-30 ENCOUNTER — Ambulatory Visit (INDEPENDENT_AMBULATORY_CARE_PROVIDER_SITE_OTHER): Payer: MEDICAID

## 2023-11-30 ENCOUNTER — Encounter (HOSPITAL_COMMUNITY): Payer: Self-pay

## 2023-11-30 VITALS — BP 158/96 | HR 119 | Wt 172.2 lb

## 2023-11-30 DIAGNOSIS — G4700 Insomnia, unspecified: Secondary | ICD-10-CM

## 2023-11-30 DIAGNOSIS — F411 Generalized anxiety disorder: Secondary | ICD-10-CM | POA: Diagnosis not present

## 2023-11-30 DIAGNOSIS — F200 Paranoid schizophrenia: Secondary | ICD-10-CM

## 2023-11-30 DIAGNOSIS — F209 Schizophrenia, unspecified: Secondary | ICD-10-CM

## 2023-11-30 NOTE — Progress Notes (Cosign Needed)
  Patient presents to the office for Abilify  Maintena 300 Mg injection in Right Deltoid. Patient seems to be doing well and will return in 28 days.

## 2023-12-28 ENCOUNTER — Ambulatory Visit (INDEPENDENT_AMBULATORY_CARE_PROVIDER_SITE_OTHER): Payer: MEDICAID | Admitting: Psychiatry

## 2023-12-28 ENCOUNTER — Ambulatory Visit (HOSPITAL_COMMUNITY): Payer: MEDICAID

## 2023-12-28 ENCOUNTER — Encounter (HOSPITAL_COMMUNITY): Payer: Self-pay | Admitting: Psychiatry

## 2023-12-28 ENCOUNTER — Encounter (HOSPITAL_COMMUNITY): Payer: Self-pay

## 2023-12-28 VITALS — BP 148/84 | HR 109 | Wt 175.2 lb

## 2023-12-28 DIAGNOSIS — F209 Schizophrenia, unspecified: Secondary | ICD-10-CM

## 2023-12-28 DIAGNOSIS — F411 Generalized anxiety disorder: Secondary | ICD-10-CM | POA: Diagnosis not present

## 2023-12-28 DIAGNOSIS — F1290 Cannabis use, unspecified, uncomplicated: Secondary | ICD-10-CM | POA: Diagnosis not present

## 2023-12-28 DIAGNOSIS — F200 Paranoid schizophrenia: Secondary | ICD-10-CM

## 2023-12-28 DIAGNOSIS — G4700 Insomnia, unspecified: Secondary | ICD-10-CM

## 2023-12-28 MED ORDER — ARIPIPRAZOLE ER 300 MG IM PRSY: 300.0000 mg | PREFILLED_SYRINGE | INTRAMUSCULAR | 11 refills | Status: DC

## 2023-12-28 MED ORDER — ARIPIPRAZOLE ER 300 MG IM PRSY
300.0000 mg | PREFILLED_SYRINGE | Freq: Once | INTRAMUSCULAR | Status: AC
  Administered 2023-12-28: 300 mg via INTRAMUSCULAR

## 2023-12-28 NOTE — Progress Notes (Signed)
 [facility] MD/PA/NP OP Progress Note  12/28/2023 11:15 AM Andres Mccall  MRN:  [id number]  Chief Complaint: "I was asked to do a Clozapine study for agression."  HPI: 28 year old male seen today for follow up psychiatric evaluation.   He has a psychiatric history of depression, anxiety, marijuana use, and schizophrenia.  He is currently managed on Abilify  300 mg monthly.  He informed Clinical research associate that Abilify  is effective in managing his psychiatric condition.   Today he is well-groomed, pleasant, cooperative, and engaged in conversation.  He informed Clinical research associate that he was asked to do a Clozapine study for agression.  He notes that he signed up for this program over a year and a half ago and they recently contacted him.  He notes that he is uncertain if he want to do this. Provider discussed the risk and benefits of being on 2 antipsychotics.  He endorsed understanding and notes that he feels like his aggression is well-managed.  He also notes that he finds Abilify  effective and denies side effects.  Patient notes that the only reason why he can continues to be seen is because he feels that it they talk to him like a therapist.  Provider asked patient if he was interested in therapy and he notes that he was.  Patient referred to outpatient counseling for therapy.  He also notes that he is uncertain if he can commit to the weekly blood draws.  Provider informed patient that he would have to make this decision for himself.  He endorsed understanding and asked writer to document his current mental health conditions and medications to present.  Provider was agreeable to this.   Since his last visit he reports that his anxiety and depression is well-managed.  Today provider conducted a GAD 7 and patient scored a 5, at his last visit he scored a 4.  Provider also conducted PHQ-9 and patient reports a 7, at his last visit he scored a 7.  He endorses adequate sleep and appetite.Today denies SI/HI/VAH, mania, or paranoia.   Patient  informed Clinical research associate that he continues to smoke Black and milds, CBD Gummies, and occasionally does THC.  Provider informed patient that these substances can exacerbate his mental health.  He endorsed understanding.  Patient notes that his children are doing well and reports that they are visiting family in [place] .    No medication changes made today.  Patient agreeable to continue medication as prescribed.  No other concerns noted at this time.   Visit Diagnosis:    ICD-10-CM   1. Generalized anxiety disorder  F41.1 Ambulatory referral to Social Work    2. Schizophrenia, unspecified type (HCC)  F20.9 ARIPiprazole  ER (ABILIFY  MAINTENA) 300 MG PRSY prefilled syringe    3. Marijuana use  F12.90 Ambulatory referral to Social Work            Past Psychiatric History:  Depression, anxiety, marijuana use, and schizophrenia  Depression, anxiety, marijuana use, and schizophrenia  Depression, anxiety, marijuana use, and schizophrenia   Past Medical History:  Past Medical History:  Diagnosis Date   Bipolar affective (HCC)    Schizophrenia (HCC)    No past surgical history on file.  Family Psychiatric History:  Denies   Family History: No family history on file.  Social History:  Social History   Socioeconomic History   Marital status: Single    Spouse name: Not on file   Number of children: Not on file   Years of education: Not on file  Highest education level: Not on file  Occupational History   Not on file  Tobacco Use   Smoking status: Every Day    Types: Cigarettes   Smokeless tobacco: Never  Substance and Sexual Activity   Alcohol  use: Yes   Drug use: Yes    Types: Marijuana    Comment: Delta 8   Sexual activity: Not on file  Other Topics Concern   Not on file  Social History Narrative   Not on file   Social Drivers of Health   Financial Resource Strain: Not on file  Food Insecurity: Not on file  Transportation Needs: Not on file  Physical Activity: Not on  file  Stress: Not on file  Social Connections: Not on file    Allergies:  Allergies  Allergen Reactions   Gentamicin Hives    Metabolic Disorder Labs: Lab Results  Component Value Date   HGBA1C 5.3 12/29/2020   MPG 105 12/29/2020   No results found for: "PROLACTIN" Lab Results  Component Value Date   CHOL 138 12/29/2020   TRIG 47 12/29/2020   HDL 52 12/29/2020   CHOLHDL 2.7 12/29/2020   VLDL 9 12/29/2020   LDLCALC 77 12/29/2020   Lab Results  Component Value Date   TSH 2.574 12/29/2020    Therapeutic Level Labs: No results found for: "LITHIUM" No results found for: "VALPROATE" No results found for: "CBMZ"  Current Medications: Current Outpatient Medications  Medication Sig Dispense Refill   ARIPiprazole  ER (ABILIFY  MAINTENA) 300 MG PRSY prefilled syringe Inject 300 mg into the muscle every 28 (twenty-eight) days. 1 each 11   doxycycline  (VIBRAMYCIN ) 100 MG capsule Take 1 capsule (100 mg total) by mouth 2 (two) times daily. 14 capsule 0   permethrin  (ELIMITE ) 5 % cream Apply from neck down before bed then wash off in the morning. May repeat in one week. 60 g 1   No current facility-administered medications for this visit.     Musculoskeletal: Strength & Muscle Tone: within normal limits Gait & Station: normal Patient leans: N/A  Psychiatric Specialty Exam: Review of Systems  There were no vitals taken for this visit.There is no height or weight on file to calculate BMI.  General Appearance: Well Groomed  Eye Contact:  Good  Speech:  Clear and Coherent and Slow  Volume:  Normal  Mood:  Euthymic  Affect:  Appropriate and Congruent  Thought Process:  Coherent, Goal Directed, and Linear  Orientation:  Full (Time, Place, and Person)  Thought Content: WDL   Suicidal Thoughts:  No  Homicidal Thoughts:  No  Memory:  Immediate;   Good Recent;   Good Remote;   Good  Judgement:  Good  Insight:  Good  Psychomotor Activity:  Normal  Concentration:   Concentration: Good and Attention Span: Good  Recall:  Good  Fund of Knowledge: Good  Language: Good  Akathisia:  No  Handed:  Right  AIMS (if indicated): Not Done   Assets:  Communication Skills Desire for Improvement Financial Resources/Insurance Housing Leisure Time Physical Health Social Support  ADL's:  Intact  Cognition: WNL  Sleep:  Good   Screenings: AIMS    Flowsheet Row Office Visit from 02/14/2023 in [facility] Office Visit from 12/20/2022 in [facility] Office Visit from 03/22/2022 in [facility] Office Visit from 05/06/2021 in [facility]  AIMS Total Score 0 0 0 0      GAD-7  Flowsheet Row Clinical Support from 12/28/2023 in [facility] Office Visit from 11/02/2023 in [facility] Office Visit from 08/03/2023 in [facility] Office Visit from 02/14/2023 in [facility] Office Visit from 12/20/2022 in [facility]  Total GAD-7 Score 5 4 6 12 11       PHQ2-9    Flowsheet Row Clinical Support from 12/28/2023 in [facility] Office Visit from 11/02/2023 in [facility] Office Visit from 08/03/2023 in [facility] Office Visit from 02/14/2023 in [facility] Office Visit from 12/20/2022 in [facility]  PHQ-2 Total Score 0 2 2 3 4   PHQ-9 Total Score 7 7 3 11 17       Flowsheet Row UC from 04/30/2022 in [facility] Visit from 03/22/2022 in [facility] Office Visit from 12/30/2021 in [facility]  C-SSRS RISK CATEGORY No Risk No Risk No Risk        Assessment and Plan: Patient reports that he is doing  well on his current medication regiman.  He notes that he has been asked to be a part of a Clozapine study for progression but notes that he is uncertain if he wants to do this.  Provider discussed risk and benefits of being on 2 antipsychotics.  He endorsed understanding and agreed.  Provider also presents as a letter to patient detailing his current medication and mental health conditions to present.No medication changes made today.  Patient agreeable to continue medication as prescribed.  1. Schizophrenia, unspecified type (HCC)  Continue- ARIPiprazole  ER (ABILIFY  MAINTENA) 300 MG PRSY prefilled syringe; Inject 300 mg into the muscle every 28 (twenty-eight) days.  Dispense: 1 each; Refill: 11  2. Generalized anxiety disorder (Primary)  - Ambulatory referral to Social Work  3. Marijuana use  - Ambulatory referral to Social Work        Follow up in 3 months Follow up in one month with shot clinic   Arlyne Bering, NP 12/28/2023, 11:15 AM

## 2023-12-28 NOTE — Progress Notes (Signed)
 Patient presents to the office for Abilify  Maintena 300 Mg injection in RIGHT Deltoid. Patient seems to doing well and will return in 28 days.

## 2024-01-08 ENCOUNTER — Telehealth (HOSPITAL_COMMUNITY): Payer: Self-pay

## 2024-01-08 NOTE — Telephone Encounter (Signed)
 Hello,   A provider at [facility] by the name Dr.Fred Monroe Antigua,   Called in inquiry for PT, states that he wants to discuss care that is being received by pt Andres Mccall.    Providers direct cell phone number: [phone] Providers/ Shaaron Dar PN: [phone]   JNL

## 2024-01-08 NOTE — Telephone Encounter (Signed)
 Patient gave verbal consent for writer to speak to Dr. Meridith Stanford. Dr. Monroe Antigua informed writer that the patient informed him that writer was not in agreement with him doing his Clozaril study.  Provider informed the doctor as well as Mr. Sawaya the risk and benefits of being on 2 antipsychotics were discussed however ultimately informed patient that the decision would be left up to him.  Patient intern informed Clinical research associate that Clozaril makes him tired and notes that he believes that he will stop it.  He does have an appointment tomorrow at [facility] with Dr.Jarskov and will discuss continuing or discontinuing the research study.

## 2024-01-25 ENCOUNTER — Ambulatory Visit (INDEPENDENT_AMBULATORY_CARE_PROVIDER_SITE_OTHER): Payer: MEDICAID

## 2024-01-25 VITALS — BP 115/80 | HR 80 | Temp 98.0°F | Ht 67.0 in | Wt 169.0 lb

## 2024-01-25 DIAGNOSIS — F209 Schizophrenia, unspecified: Secondary | ICD-10-CM

## 2024-01-25 MED ORDER — ARIPIPRAZOLE ER 300 MG IM PRSY
300.0000 mg | PREFILLED_SYRINGE | Freq: Once | INTRAMUSCULAR | Status: AC
  Administered 2024-01-25: 300 mg via INTRAMUSCULAR

## 2024-01-25 NOTE — Progress Notes (Cosign Needed)
 patient presents to the CLINIC for Abilify  Maintena 300 Mg injection WAS GIVEN IN  in RIGHT Deltoid. Patient seems to doing well and will return in 28 days. PT DENIES ALL AVH,SI, AND HI    JNL,CMA

## 2024-02-22 ENCOUNTER — Ambulatory Visit (INDEPENDENT_AMBULATORY_CARE_PROVIDER_SITE_OTHER): Payer: MEDICAID

## 2024-02-22 VITALS — BP 125/71 | HR 70 | Ht 67.0 in | Wt 179.6 lb

## 2024-02-22 DIAGNOSIS — F209 Schizophrenia, unspecified: Secondary | ICD-10-CM

## 2024-02-22 MED ORDER — ARIPIPRAZOLE ER 300 MG IM PRSY
300.0000 mg | PREFILLED_SYRINGE | Freq: Once | INTRAMUSCULAR | Status: AC
  Administered 2024-02-22: 300 mg via INTRAMUSCULAR

## 2024-02-22 NOTE — Progress Notes (Cosign Needed)
 Patient presents to the CLINIC for Abilify  Maintena 300 Mg injection WAS GIVEN IN  in Left Deltoid. Patient seems to doing well and will return in 28 days. PT DENIES ALL AVH,SI, AND HI.   JNL,CMA

## 2024-03-05 ENCOUNTER — Ambulatory Visit (INDEPENDENT_AMBULATORY_CARE_PROVIDER_SITE_OTHER): Payer: MEDICAID | Admitting: Mental Health

## 2024-03-05 DIAGNOSIS — F4310 Post-traumatic stress disorder, unspecified: Secondary | ICD-10-CM | POA: Insufficient documentation

## 2024-03-05 DIAGNOSIS — F1490 Cocaine use, unspecified, uncomplicated: Secondary | ICD-10-CM | POA: Insufficient documentation

## 2024-03-05 DIAGNOSIS — F250 Schizoaffective disorder, bipolar type: Secondary | ICD-10-CM | POA: Diagnosis not present

## 2024-03-05 DIAGNOSIS — F411 Generalized anxiety disorder: Secondary | ICD-10-CM

## 2024-03-05 DIAGNOSIS — F1290 Cannabis use, unspecified, uncomplicated: Secondary | ICD-10-CM | POA: Insufficient documentation
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
## 2024-03-21 ENCOUNTER — Encounter (HOSPITAL_COMMUNITY): Payer: Self-pay

## 2024-03-21 ENCOUNTER — Ambulatory Visit (INDEPENDENT_AMBULATORY_CARE_PROVIDER_SITE_OTHER): Payer: MEDICAID | Admitting: Family

## 2024-03-21 ENCOUNTER — Ambulatory Visit (INDEPENDENT_AMBULATORY_CARE_PROVIDER_SITE_OTHER): Payer: MEDICAID

## 2024-03-21 ENCOUNTER — Ambulatory Visit (HOSPITAL_COMMUNITY): Payer: MEDICAID

## 2024-03-21 VITALS — BP 110/69 | HR 80 | Ht 67.0 in | Wt 177.2 lb

## 2024-03-21 DIAGNOSIS — F250 Schizoaffective disorder, bipolar type: Secondary | ICD-10-CM | POA: Diagnosis not present

## 2024-03-21 DIAGNOSIS — F4310 Post-traumatic stress disorder, unspecified: Secondary | ICD-10-CM

## 2024-03-21 DIAGNOSIS — F411 Generalized anxiety disorder: Secondary | ICD-10-CM | POA: Diagnosis not present

## 2024-03-21 MED ORDER — ARIPIPRAZOLE ER 300 MG IM PRSY
300.0000 mg | PREFILLED_SYRINGE | Freq: Once | INTRAMUSCULAR | Status: AC
  Administered 2024-03-21: 300 mg via INTRAMUSCULAR

## 2024-03-21 NOTE — Progress Notes (Signed)
 BH MD/PA/NP OP Progress Note  03/21/2024 3:46 PM Andres Mccall  MRN:  [id number]  Chief Complaint: I was asked to do a Clozapine study for agression.  HPI: 28 year old male seen today for follow up psychiatric evaluation.   He has a psychiatric history of depression, anxiety, marijuana use, and schizophrenia.  He is currently managed on Abilify 300 mg monthly.  He informed Clinical research associate that Abilify is effective in managing his psychiatric condition.  Patient presents well-groomed, pleasant, cooperative, and engaged in conversation. He reports that his anxiety and depression are well managed with his current regimen and finds Abilify effective, denying any side effects. He wishes to continue his current medications. GAD-7 score today is 5 (previously 4), and PHQ-9 score is 7 (unchanged from last visit). He endorses adequate sleep and appetite.  He reports continued use of Black and Mild cigars, CBD gummies, and occasional THC use, as well as vaping THC cartridges four to five times daily. He verbalizes understanding of the impact marijuana can have on his mental health after education provided. He shares that his children are doing well and are visiting family in [place] . He attended today's appointment with his two younger children and expressed excitement about receiving an invitation for a job interview. He denies suicidal ideation, homicidal ideation, visual/auditory hallucinations, mania, or paranoia.  Objective: Patient is alert and oriented to person, place, time, and situation. Well-groomed and appropriately dressed. Engages well and maintains appropriate eye contact. Thought process is logical and goal-directed. Mood appears euthymic with congruent affect. Speech is clear, coherent, and of normal rate and volume. No abnormal movements, psychomotor agitation, or retardation noted. No perceptual disturbances observed or reported. Insight and judgment appear fair.   Visit Diagnosis:    ICD-10-CM    1. Schizoaffective disorder, bipolar type (HCC)  F25.0     2. Generalized anxiety disorder  F41.1     3. PTSD (post-traumatic stress disorder)  F43.10         Past Psychiatric History:  Depression, anxiety, marijuana use, and schizophrenia  Depression, anxiety, marijuana use, and schizophrenia  Depression, anxiety, marijuana use, and schizophrenia   Past Medical History:  Past Medical History:  Diagnosis Date   Bipolar affective (HCC)    Schizophrenia (HCC)    No past surgical history on file.  Family Psychiatric History:  Denies   Family History: No family history on file.  Social History:  Social History   Socioeconomic History   Marital status: Single    Spouse name: Not on file   Number of children: Not on file   Years of education: Not on file   Highest education level: Not on file  Occupational History   Not on file  Tobacco Use   Smoking status: Every Day    Types: Cigarettes   Smokeless tobacco: Never  Substance and Sexual Activity   Alcohol use: Yes   Drug use: Yes    Types: Marijuana    Comment: Delta 8   Sexual activity: Not on file  Other Topics Concern   Not on file  Social History Narrative   Not on file   Social Drivers of Health   Financial Resource Strain: High Risk (03/05/2024)   Overall Financial Resource Strain (CARDIA)    Difficulty of Paying Living Expenses: Hard  Food Insecurity: No Food Insecurity (03/05/2024)   Hunger Vital Sign    Worried About Running Out of Food in the Last Year: Never true    Ran Out of Food in the Last  Year: Never true  Transportation Needs: Unmet Transportation Needs (03/05/2024)   PRAPARE - Transportation    Lack of Transportation (Medical): Yes    Lack of Transportation (Non-Medical): Yes  Physical Activity: Inactive (03/05/2024)   Exercise Vital Sign    Days of Exercise per Week: 0 days    Minutes of Exercise per Session: 0 min  Stress: No Stress Concern Present (03/05/2024)   [employer] of  Occupational Health - Occupational Stress Questionnaire    Feeling of Stress: Only a little  Social Connections: Socially Isolated (03/05/2024)   Social Connection and Isolation Panel    Frequency of Communication with Friends and Family: Twice a week    Frequency of Social Gatherings with Friends and Family: Twice a week    Attends Religious Services: Never    Database administrator or Organizations: No    Attends Banker Meetings: Never    Marital Status: Never married    Allergies:  Allergies  Allergen Reactions   Gentamicin Hives    Metabolic Disorder Labs: Lab Results  Component Value Date   HGBA1C 5.3 12/29/2020   MPG 105 12/29/2020   No results found for: PROLACTIN Lab Results  Component Value Date   CHOL 138 12/29/2020   TRIG 47 12/29/2020   HDL 52 12/29/2020   CHOLHDL 2.7 12/29/2020   VLDL 9 12/29/2020   LDLCALC 77 12/29/2020   Lab Results  Component Value Date   TSH 2.574 12/29/2020    Therapeutic Level Labs: No results found for: LITHIUM No results found for: VALPROATE No results found for: CBMZ  Current Medications: Current Outpatient Medications  Medication Sig Dispense Refill   ARIPiprazole ER (ABILIFY MAINTENA) 300 MG PRSY prefilled syringe Inject 300 mg into the muscle every 28 (twenty-eight) days. 1 each 11   doxycycline (VIBRAMYCIN) 100 MG capsule Take 1 capsule (100 mg total) by mouth 2 (two) times daily. 14 capsule 0   permethrin (ELIMITE) 5 % cream Apply from neck down before bed then wash off in the morning. May repeat in one week. 60 g 1   No current facility-administered medications for this visit.     Musculoskeletal: Strength & Muscle Tone: within normal limits Gait & Station: normal Patient leans: N/A  Psychiatric Specialty Exam: Review of Systems  There were no vitals taken for this visit.There is no height or weight on file to calculate BMI.  General Appearance: Well Groomed  Eye Contact:  Good  Speech:   Clear and Coherent and Slow  Volume:  Normal  Mood:  Euthymic  Affect:  Appropriate and Congruent  Thought Process:  Coherent, Goal Directed, and Linear  Orientation:  Full (Time, Place, and Person)  Thought Content: WDL   Suicidal Thoughts:  No  Homicidal Thoughts:  No  Memory:  Immediate;   Good Recent;   Good Remote;   Good  Judgement:  Good  Insight:  Good  Psychomotor Activity:  Normal  Concentration:  Concentration: Good and Attention Span: Good  Recall:  Good  Fund of Knowledge: Good  Language: Good  Akathisia:  No  Handed:  Right  AIMS (if indicated): Not Done   Assets:  Communication Skills Desire for Improvement Financial Resources/Insurance Housing Leisure Time Physical Health Social Support  ADL's:  Intact  Cognition: WNL  Sleep:  Good   Screenings: AIMS    Flowsheet Row Office Visit from 02/14/2023 in [facility] Office Visit from 12/20/2022 in [facility] Office Visit  from 03/22/2022 in [facility] Office Visit from 05/06/2021 in [facility]  AIMS Total Score 0 0 0 0   AUDIT    Flowsheet Row Counselor from 03/05/2024 in [facility]  Alcohol Use Disorder Identification Test Final Score (AUDIT) 3   GAD-7    Flowsheet Row Counselor from 03/05/2024 in [facility] Clinical Support from 12/28/2023 in [facility] Office Visit from 11/02/2023 in [facility] Office Visit from 08/03/2023 in [facility] Office Visit from 02/14/2023 in [facility]  Total GAD-7 Score 7 5 4 6 12    [phone]    Flowsheet Row Counselor from 03/05/2024 in [facility] Clinical Support from 12/28/2023 in [facility] Office Visit from 11/02/2023 in [facility] Office Visit from 08/03/2023 in [facility] Office Visit from 02/14/2023 in [facility]  PHQ-2 Total Score 1 0 2 2 3   PHQ-9 Total Score 6 7 7 3 11    Flowsheet Row Counselor from 03/05/2024 in [facility] UC from 04/30/2022 in [facility] [address] Visit from 03/22/2022 in [facility]  C-SSRS RISK CATEGORY No Risk No Risk No Risk     Assessment and Plan: Patient reports that he is doing well on his current medication regiman.  No medication changes made today.  Patient agreeable to continue medication as prescribed.  1. Schizophrenia, unspecified type (HCC)  Continue- ARIPiprazole ER (ABILIFY MAINTENA) 300 MG PRSY prefilled syringe; Inject 300 mg into the muscle every 28 (twenty-eight) days.  Dispense: 1 each; Refill: 11  2. Generalized anxiety disorder (Primary)  - Ambulatory referral to Social Work  3. Marijuana use  - Ambulatory referral to Social Work        Follow up in 3 months Follow up in one month with shot clinic   Majel GORMAN Ramp, FNP 03/21/2024, 3:46 PM

## 2024-03-21 NOTE — Progress Notes (Cosign Needed)
 Pt presented to office for injection of Abilify 300 mg. Patient received injection in right deltoid. Patient tolerated injection well. No complaints noted. Patient will return in 28 days.

## 2024-04-18 ENCOUNTER — Ambulatory Visit (INDEPENDENT_AMBULATORY_CARE_PROVIDER_SITE_OTHER): Payer: MEDICAID

## 2024-04-18 ENCOUNTER — Ambulatory Visit (HOSPITAL_COMMUNITY): Payer: MEDICAID

## 2024-04-18 VITALS — BP 131/76 | HR 76 | Ht 72.0 in | Wt 180.0 lb

## 2024-04-18 DIAGNOSIS — F209 Schizophrenia, unspecified: Secondary | ICD-10-CM | POA: Diagnosis not present

## 2024-04-18 MED ORDER — ARIPIPRAZOLE ER 300 MG IM PRSY
300.0000 mg | PREFILLED_SYRINGE | INTRAMUSCULAR | Status: AC
  Administered 2024-04-18 – 2024-09-10 (×6): 300 mg via INTRAMUSCULAR

## 2024-04-18 NOTE — Progress Notes (Cosign Needed)
 Patient arrives today for his due injection of Abilify  Maintena 300 mg. Patient arrived well groomed and with an appropriate affect. Patient denies any SI/HI or AVS. Injection was prepared as ordered and administered in patients Right Deltoid. Patient tolerated well and without complaint.   NDC: 40851-954-19 LOT: [id number] EXP: NOV 2027

## 2024-04-24 ENCOUNTER — Ambulatory Visit (HOSPITAL_COMMUNITY): Payer: MEDICAID

## 2024-05-16 ENCOUNTER — Encounter (HOSPITAL_COMMUNITY): Payer: Self-pay

## 2024-05-16 ENCOUNTER — Ambulatory Visit (INDEPENDENT_AMBULATORY_CARE_PROVIDER_SITE_OTHER): Payer: MEDICAID

## 2024-05-16 VITALS — BP 148/96 | HR 103 | Temp 98.0°F | Ht 72.0 in | Wt 173.4 lb

## 2024-05-16 DIAGNOSIS — F1490 Cocaine use, unspecified, uncomplicated: Secondary | ICD-10-CM

## 2024-05-16 DIAGNOSIS — F250 Schizoaffective disorder, bipolar type: Secondary | ICD-10-CM

## 2024-05-16 DIAGNOSIS — G4700 Insomnia, unspecified: Secondary | ICD-10-CM

## 2024-05-16 DIAGNOSIS — F200 Paranoid schizophrenia: Secondary | ICD-10-CM

## 2024-05-16 DIAGNOSIS — F4310 Post-traumatic stress disorder, unspecified: Secondary | ICD-10-CM

## 2024-05-16 DIAGNOSIS — F1994 Other psychoactive substance use, unspecified with psychoactive substance-induced mood disorder: Secondary | ICD-10-CM

## 2024-05-16 DIAGNOSIS — F1210 Cannabis abuse, uncomplicated: Secondary | ICD-10-CM

## 2024-05-16 DIAGNOSIS — F411 Generalized anxiety disorder: Secondary | ICD-10-CM

## 2024-05-16 NOTE — Progress Notes (Cosign Needed)
 Patient came into the office today to get his abilify  maintena 300mg  injection. Patient states that the abilify  is working for him.  He has no thoughts of hurting himself or other. He was talkative and well dressed.      Pt BP was 1489/96 and Pulse was 103. Spoke with Zane Bach and she stated that it was ok to give injection.  Pt was given the signs and symptoms of heart attack and stroke. Pt was advised that if he has any of those symptoms to go to the nearest ER. Pt agreed. Pt states that he needs to update his insurance. Pt was advised that as soon as he does to get established with a primary care provider.   Pt was given the injection in the left deltoid. Injection was prepared and given by harlene fridge, cma.   Pt tolerated injection well.   Pt to return in 28 days.        NDC 40851-954-19  Lot # [id number] exp 06-08-2026   SN# [id number]  GTTN# [id number]

## 2024-05-16 NOTE — Patient Instructions (Signed)
 Patient came into the office today to get his abilify  maintena 300mg  injection. Patient states that the abilify  is working for him.  He has no thoughts of hurting himself or other. He was talkative and well dressed.      Pt BP was 1489/96 and Pulse was 103. Spoke with Zane Bach and she stated that it was ok to give injection.  Pt was given the signs and symptoms of heart attack and stroke. Pt was advised that if he has any of those symptoms to go to the nearest ER. Pt agreed. Pt states that he needs to update his insurance. Pt was advised that as soon as he does to get established with a primary care provider.   Pt was given the injection in the left deltoid. Injection was prepared and given by harlene fridge, cma.   Pt tolerated injection well.   Pt to return in 28 days.        NDC 40851-954-19  Lot # [id number] exp 06-08-2026   SN# [id number]  GTTN# [id number]

## 2024-05-22 ENCOUNTER — Telehealth (HOSPITAL_COMMUNITY): Payer: Self-pay

## 2024-05-22 NOTE — Telephone Encounter (Signed)
 Patient is requesting a letter stating that he is being seen at this office. Could you help with this request?

## 2024-05-23 ENCOUNTER — Encounter (HOSPITAL_COMMUNITY): Payer: Self-pay | Admitting: Psychiatry

## 2024-05-23 NOTE — Telephone Encounter (Signed)
Letter written and emailed to patient.

## 2024-06-13 ENCOUNTER — Telehealth (HOSPITAL_COMMUNITY): Payer: Self-pay

## 2024-06-13 ENCOUNTER — Ambulatory Visit (INDEPENDENT_AMBULATORY_CARE_PROVIDER_SITE_OTHER): Payer: MEDICAID

## 2024-06-13 ENCOUNTER — Ambulatory Visit (INDEPENDENT_AMBULATORY_CARE_PROVIDER_SITE_OTHER): Payer: MEDICAID | Admitting: Psychiatry

## 2024-06-13 ENCOUNTER — Encounter (HOSPITAL_COMMUNITY): Payer: Self-pay

## 2024-06-13 VITALS — BP 130/86 | Ht 72.0 in | Wt 175.0 lb

## 2024-06-13 DIAGNOSIS — F250 Schizoaffective disorder, bipolar type: Secondary | ICD-10-CM

## 2024-06-13 DIAGNOSIS — F209 Schizophrenia, unspecified: Secondary | ICD-10-CM | POA: Diagnosis not present

## 2024-06-13 MED ORDER — ARIPIPRAZOLE ER 300 MG IM PRSY: 300.0000 mg | PREFILLED_SYRINGE | INTRAMUSCULAR | 11 refills | Status: AC

## 2024-06-13 NOTE — Progress Notes (Cosign Needed)
 Patient presents to the CLINIC for Abilify  Maintena 300 Mg injection WAS GIVEN IN  in RIGHT-Deltoid. Patient seems to doing well and will return in 28 days. PT DENIES ALL AVH,SI, AND HI.    JNL,CMA

## 2024-06-13 NOTE — Progress Notes (Signed)
 BH MD/PA/NP OP Progress Note  06/13/2024 9:57 AM Andres Mccall  MRN:  [id number]  Chief Complaint: I got a job  HPI: 28 year old male seen today for follow up psychiatric evaluation.   He has a psychiatric history of depression, anxiety, marijuana use, and schizophrenia.  He is currently managed on Abilify  300 mg monthly.  He informed clinical research associate that Abilify  is effective in managing his psychiatric condition.   Today he is well-groomed, pleasant, cooperative, and engaged in conversation.  He informed clinical research associate that recently he got a new job at [employer]. He notes that he finds enjoyment in his job. Patient notes that his kids are doing well.    Mentally patient reports that his mood is stable and notes that he has minimal anxiety and depression.   Today provider conducted a GAD 7 and patient scored a 5, at his last visit he scored a 7.  Provider also conducted PHQ-9 and patient reports a 8, at his last visit he scored a 6.  He endorses adequate sleep and appetite.Today denies SI/HI/VAH, mania, or paranoia.     No medication changes made today.  Patient agreeable to continue medication as prescribed.  No other concerns noted at this time.   Visit Diagnosis:    ICD-10-CM   1. Schizophrenia, unspecified type (HCC)  F20.9 ARIPiprazole  ER (ABILIFY  MAINTENA) 300 MG PRSY prefilled syringe             Past Psychiatric History:  Depression, anxiety, marijuana use, and schizophrenia  Depression, anxiety, marijuana use, and schizophrenia  Depression, anxiety, marijuana use, and schizophrenia   Past Medical History:  Past Medical History:  Diagnosis Date   Bipolar affective (HCC)    Schizophrenia (HCC)    No past surgical history on file.  Family Psychiatric History:  Denies   Family History: No family history on file.  Social History:  Social History   Socioeconomic History   Marital status: Single    Spouse name: Not on file   Number of children: Not on file   Years of education: Not on  file   Highest education level: Not on file  Occupational History   Not on file  Tobacco Use   Smoking status: Every Day    Types: Cigarettes   Smokeless tobacco: Never  Substance and Sexual Activity   Alcohol  use: Yes   Drug use: Yes    Types: Marijuana    Comment: Delta 8   Sexual activity: Not on file  Other Topics Concern   Not on file  Social History Narrative   Not on file   Social Drivers of Health   Financial Resource Strain: High Risk (03/05/2024)   Overall Financial Resource Strain (CARDIA)    Difficulty of Paying Living Expenses: Hard  Food Insecurity: No Food Insecurity (03/05/2024)   Hunger Vital Sign    Worried About Running Out of Food in the Last Year: Never true    Ran Out of Food in the Last Year: Never true  Transportation Needs: Unmet Transportation Needs (03/05/2024)   PRAPARE - Transportation    Lack of Transportation (Medical): Yes    Lack of Transportation (Non-Medical): Yes  Physical Activity: Inactive (03/05/2024)   Exercise Vital Sign    Days of Exercise per Week: 0 days    Minutes of Exercise per Session: 0 min  Stress: No Stress Concern Present (03/05/2024)   [employer] of Occupational Health - Occupational Stress Questionnaire    Feeling of Stress: Only a little  Social  Connections: Socially Isolated (03/05/2024)   Social Connection and Isolation Panel    Frequency of Communication with Friends and Family: Twice a week    Frequency of Social Gatherings with Friends and Family: Twice a week    Attends Religious Services: Never    Database Administrator or Organizations: No    Attends Banker Meetings: Never    Marital Status: Never married    Allergies:  Allergies  Allergen Reactions   Gentamicin Hives    Metabolic Disorder Labs: Lab Results  Component Value Date   HGBA1C 5.3 12/29/2020   MPG 105 12/29/2020   No results found for: PROLACTIN Lab Results  Component Value Date   CHOL 138 12/29/2020   TRIG 47  12/29/2020   HDL 52 12/29/2020   CHOLHDL 2.7 12/29/2020   VLDL 9 12/29/2020   LDLCALC 77 12/29/2020   Lab Results  Component Value Date   TSH 2.574 12/29/2020    Therapeutic Level Labs: No results found for: LITHIUM No results found for: VALPROATE No results found for: CBMZ  Current Medications: Current Outpatient Medications  Medication Sig Dispense Refill   ARIPiprazole  ER (ABILIFY  MAINTENA) 300 MG PRSY prefilled syringe Inject 300 mg into the muscle every 28 (twenty-eight) days. 1 each 11   doxycycline  (VIBRAMYCIN ) 100 MG capsule Take 1 capsule (100 mg total) by mouth 2 (two) times daily. 14 capsule 0   permethrin  (ELIMITE ) 5 % cream Apply from neck down before bed then wash off in the morning. May repeat in one week. 60 g 1   Current Facility-Administered Medications  Medication Dose Route Frequency Provider Last Rate Last Admin   ARIPiprazole  ER (ABILIFY  MAINTENA) 300 MG prefilled syringe 300 mg  300 mg Intramuscular Q28 days Ezzard Staci SAILOR, NP   300 mg at 06/13/24 9060     Musculoskeletal: Strength & Muscle Tone: within normal limits Gait & Station: normal Patient leans: N/A  Psychiatric Specialty Exam: Review of Systems  There were no vitals taken for this visit.There is no height or weight on file to calculate BMI.  General Appearance: Well Groomed  Eye Contact:  Good  Speech:  Clear and Coherent and Slow  Volume:  Normal  Mood:  Euthymic  Affect:  Appropriate and Congruent  Thought Process:  Coherent, Goal Directed, and Linear  Orientation:  Full (Time, Place, and Person)  Thought Content: WDL   Suicidal Thoughts:  No  Homicidal Thoughts:  No  Memory:  Immediate;   Good Recent;   Good Remote;   Good  Judgement:  Good  Insight:  Good  Psychomotor Activity:  Normal  Concentration:  Concentration: Good and Attention Span: Good  Recall:  Good  Fund of Knowledge: Good  Language: Good  Akathisia:  No  Handed:  Right  AIMS (if indicated): Not Done    Assets:  Communication Skills Desire for Improvement Financial Resources/Insurance Housing Leisure Time Physical Health Social Support  ADL's:  Intact  Cognition: WNL  Sleep:  Good   Screenings: AIMS    Flowsheet Row Office Visit from 02/14/2023 in [facility] Office Visit from 12/20/2022 in [facility] Office Visit from 03/22/2022 in [facility] Office Visit from 05/06/2021 in [facility]  AIMS Total Score 0 0 0 0   AUDIT    Flowsheet Row Counselor from 03/05/2024 in [facility]  Alcohol  Use Disorder Identification Test Final Score (AUDIT) 3  GAD-7    Flowsheet Row Office Visit from 06/13/2024 in [facility] Counselor from 03/05/2024 in [facility] Clinical Support from 12/28/2023 in [facility] Office Visit from 11/02/2023 in [facility] Office Visit from 08/03/2023 in [facility]  Total GAD-7 Score 5 7 5 4 6    PHQ2-9    Flowsheet Row Office Visit from 06/13/2024 in [facility] Counselor from 03/05/2024 in [facility] Clinical Support from 12/28/2023 in [facility] Office Visit from 11/02/2023 in [facility] Office Visit from 08/03/2023 in [facility]  PHQ-2 Total Score 2 1 0 2 2  PHQ-9 Total Score 8 6 7 7 3    Flowsheet Row Counselor from 03/05/2024 in [facility] UC from 04/30/2022 in [facility] at [facility] Visit from 03/22/2022 in [facility]  C-SSRS RISK CATEGORY No Risk No Risk No Risk     Assessment and Plan: Patient reports that he is doing well on his current medication regiman.   No medication changes made today.  Patient agreeable to continue medication as prescribed.  1. Schizophrenia, unspecified type (HCC)  Continue- ARIPiprazole  ER (ABILIFY  MAINTENA) 300 MG PRSY prefilled syringe; Inject 300 mg into the muscle every 28 (twenty-eight) days.  Dispense: 1 each; Refill: 11          Follow up in 3 months Follow up in one month with shot clinic   Zane FORBES Bach, NP 06/13/2024, 9:57 AM

## 2024-06-13 NOTE — Telephone Encounter (Addendum)
 Pt wanted letter regarding his [employer] probation documentation. This was given by Dr. Harl on 06/13/2024 to pt.   JNL

## 2024-07-11 ENCOUNTER — Ambulatory Visit (INDEPENDENT_AMBULATORY_CARE_PROVIDER_SITE_OTHER): Payer: MEDICAID

## 2024-07-11 ENCOUNTER — Encounter (HOSPITAL_COMMUNITY): Payer: Self-pay

## 2024-07-11 VITALS — BP 150/83 | HR 110 | Wt 178.8 lb

## 2024-07-11 DIAGNOSIS — F200 Paranoid schizophrenia: Secondary | ICD-10-CM | POA: Diagnosis not present

## 2024-07-11 NOTE — Progress Notes (Signed)
 Patient presents to the CLINIC for Abilify  Maintena 300 Mg injection WAS GIVEN IN  in LEFT-Deltoid. Patient seems to doing well and will return in 28 days. PT DENIES ALL AVH,SI, AND HI.

## 2024-08-13 ENCOUNTER — Encounter (HOSPITAL_COMMUNITY): Payer: Self-pay

## 2024-08-13 ENCOUNTER — Ambulatory Visit (INDEPENDENT_AMBULATORY_CARE_PROVIDER_SITE_OTHER): Payer: MEDICAID

## 2024-08-13 VITALS — BP 133/89 | HR 122 | Temp 99.4°F | Ht 72.0 in | Wt 172.0 lb

## 2024-08-13 DIAGNOSIS — F209 Schizophrenia, unspecified: Secondary | ICD-10-CM

## 2024-08-13 DIAGNOSIS — F250 Schizoaffective disorder, bipolar type: Secondary | ICD-10-CM

## 2024-08-13 NOTE — Progress Notes (Signed)
 Pt presented for Injection of Abilify  300 mg   in Right Deltoid. Check blood pressure twice and Pt blood pressure was in hypertension stage 2 both times. Dr. Renaye was advised and spoke with patient in regards of BP and instructed Pt to sit for 5 minutes after receiving injection. Pt tolerated injection well. Pt denies SI/HI/AVH.  Pt will return in 28  days.   CJT-CMA Student  SN [id number] EXP [id number]

## 2024-09-10 ENCOUNTER — Encounter (HOSPITAL_COMMUNITY): Payer: Self-pay

## 2024-09-10 ENCOUNTER — Ambulatory Visit (HOSPITAL_COMMUNITY): Payer: MEDICAID

## 2024-09-10 ENCOUNTER — Ambulatory Visit (HOSPITAL_COMMUNITY): Payer: Self-pay

## 2024-09-10 VITALS — BP 139/82 | HR 122 | Wt 178.8 lb

## 2024-09-10 DIAGNOSIS — F200 Paranoid schizophrenia: Secondary | ICD-10-CM

## 2024-09-10 DIAGNOSIS — F411 Generalized anxiety disorder: Secondary | ICD-10-CM

## 2024-09-12 ENCOUNTER — Ambulatory Visit (HOSPITAL_COMMUNITY): Payer: Self-pay

## 2024-10-08 ENCOUNTER — Ambulatory Visit (HOSPITAL_COMMUNITY): Payer: Self-pay
